# Patient Record
Sex: Female | Born: 1975 | Race: Black or African American | Hispanic: No | Marital: Single | Smoking: Never smoker
Health system: Southern US, Community
[De-identification: ages and names within clinical notes are randomized; demographics above are authoritative.]

## PROBLEM LIST (undated history)

## (undated) DIAGNOSIS — I2510 Atherosclerotic heart disease of native coronary artery without angina pectoris: Secondary | ICD-10-CM

## (undated) DIAGNOSIS — I5032 Chronic diastolic (congestive) heart failure: Secondary | ICD-10-CM

## (undated) DIAGNOSIS — I119 Hypertensive heart disease without heart failure: Secondary | ICD-10-CM

## (undated) HISTORY — DX: Hypertensive heart disease without heart failure: I11.9

## (undated) HISTORY — PX: OTHER SURGICAL HISTORY: SHX169

## (undated) HISTORY — DX: Chronic diastolic (congestive) heart failure: I50.32

## (undated) HISTORY — DX: Atherosclerotic heart disease of native coronary artery without angina pectoris: I25.10

---

## 2014-10-08 ENCOUNTER — Emergency Department (HOSPITAL_COMMUNITY): Payer: Self-pay

## 2014-10-08 ENCOUNTER — Encounter (HOSPITAL_COMMUNITY): Payer: Self-pay | Admitting: Emergency Medicine

## 2014-10-08 ENCOUNTER — Inpatient Hospital Stay (HOSPITAL_COMMUNITY)
Admission: EM | Admit: 2014-10-08 | Discharge: 2014-10-16 | DRG: 683 | Disposition: A | Discharge status: Home / Self Care | Payer: Self-pay | Attending: Internal Medicine | Admitting: Internal Medicine

## 2014-10-08 ENCOUNTER — Inpatient Hospital Stay (HOSPITAL_COMMUNITY): Payer: Self-pay

## 2014-10-08 DIAGNOSIS — R51 Headache: Secondary | ICD-10-CM | POA: Diagnosis present

## 2014-10-08 DIAGNOSIS — Z6841 Body Mass Index (BMI) 40.0 and over, adult: Secondary | ICD-10-CM

## 2014-10-08 DIAGNOSIS — I129 Hypertensive chronic kidney disease with stage 1 through stage 4 chronic kidney disease, or unspecified chronic kidney disease: Principal | ICD-10-CM | POA: Diagnosis present

## 2014-10-08 DIAGNOSIS — I10 Essential (primary) hypertension: Secondary | ICD-10-CM

## 2014-10-08 DIAGNOSIS — D649 Anemia, unspecified: Secondary | ICD-10-CM | POA: Diagnosis present

## 2014-10-08 DIAGNOSIS — Z7982 Long term (current) use of aspirin: Secondary | ICD-10-CM

## 2014-10-08 DIAGNOSIS — H538 Other visual disturbances: Secondary | ICD-10-CM | POA: Diagnosis present

## 2014-10-08 DIAGNOSIS — R519 Headache, unspecified: Secondary | ICD-10-CM

## 2014-10-08 DIAGNOSIS — N183 Chronic kidney disease, stage 3 (moderate): Secondary | ICD-10-CM | POA: Diagnosis present

## 2014-10-08 DIAGNOSIS — R778 Other specified abnormalities of plasma proteins: Secondary | ICD-10-CM | POA: Diagnosis present

## 2014-10-08 DIAGNOSIS — Z885 Allergy status to narcotic agent status: Secondary | ICD-10-CM

## 2014-10-08 DIAGNOSIS — I248 Other forms of acute ischemic heart disease: Secondary | ICD-10-CM | POA: Diagnosis present

## 2014-10-08 DIAGNOSIS — R7309 Other abnormal glucose: Secondary | ICD-10-CM | POA: Diagnosis present

## 2014-10-08 DIAGNOSIS — N179 Acute kidney failure, unspecified: Secondary | ICD-10-CM | POA: Diagnosis not present

## 2014-10-08 DIAGNOSIS — R7989 Other specified abnormal findings of blood chemistry: Secondary | ICD-10-CM

## 2014-10-08 DIAGNOSIS — I25119 Atherosclerotic heart disease of native coronary artery with unspecified angina pectoris: Secondary | ICD-10-CM | POA: Diagnosis present

## 2014-10-08 DIAGNOSIS — Z79899 Other long term (current) drug therapy: Secondary | ICD-10-CM

## 2014-10-08 DIAGNOSIS — I160 Hypertensive urgency: Secondary | ICD-10-CM | POA: Diagnosis present

## 2014-10-08 DIAGNOSIS — I422 Other hypertrophic cardiomyopathy: Secondary | ICD-10-CM | POA: Diagnosis present

## 2014-10-08 DIAGNOSIS — E669 Obesity, unspecified: Secondary | ICD-10-CM | POA: Diagnosis present

## 2014-10-08 DIAGNOSIS — R079 Chest pain, unspecified: Secondary | ICD-10-CM

## 2014-10-08 DIAGNOSIS — Z888 Allergy status to other drugs, medicaments and biological substances status: Secondary | ICD-10-CM

## 2014-10-08 DIAGNOSIS — E282 Polycystic ovarian syndrome: Secondary | ICD-10-CM | POA: Diagnosis present

## 2014-10-08 LAB — URINALYSIS, ROUTINE W REFLEX MICROSCOPIC
Bilirubin Urine: NEGATIVE
Glucose, UA: NEGATIVE mg/dL
Hgb urine dipstick: NEGATIVE
KETONES UR: NEGATIVE mg/dL
Leukocytes, UA: NEGATIVE
NITRITE: NEGATIVE
Protein, ur: NEGATIVE mg/dL
Specific Gravity, Urine: 1.02 (ref 1.005–1.030)
UROBILINOGEN UA: 0.2 mg/dL (ref 0.0–1.0)
pH: 6.5 (ref 5.0–8.0)

## 2014-10-08 LAB — BASIC METABOLIC PANEL WITH GFR
Anion gap: 9 (ref 5–15)
BUN: 16 mg/dL (ref 6–23)
CO2: 28 mmol/L (ref 19–32)
Calcium: 9.4 mg/dL (ref 8.4–10.5)
Chloride: 101 meq/L (ref 96–112)
Creatinine, Ser: 1.21 mg/dL — ABNORMAL HIGH (ref 0.50–1.10)
GFR calc Af Amer: 65 mL/min — ABNORMAL LOW (ref >90)
GFR calc non Af Amer: 56 mL/min — ABNORMAL LOW (ref >90)
Glucose, Bld: 130 mg/dL — ABNORMAL HIGH (ref 70–99)
Potassium: 3.2 mmol/L — ABNORMAL LOW (ref 3.5–5.1)
Sodium: 138 mmol/L (ref 135–145)

## 2014-10-08 LAB — BRAIN NATRIURETIC PEPTIDE: B Natriuretic Peptide: 22.1 pg/mL (ref 0.0–100.0)

## 2014-10-08 LAB — MRSA PCR SCREENING: MRSA by PCR: NEGATIVE

## 2014-10-08 LAB — CBC
HCT: 40.6 % (ref 36.0–46.0)
Hemoglobin: 13.3 g/dL (ref 12.0–15.0)
MCH: 27.1 pg (ref 26.0–34.0)
MCHC: 32.8 g/dL (ref 30.0–36.0)
MCV: 82.9 fL (ref 78.0–100.0)
PLATELETS: 317 10*3/uL (ref 150–400)
RBC: 4.9 MIL/uL (ref 3.87–5.11)
RDW: 14.4 % (ref 11.5–15.5)
WBC: 6.3 10*3/uL (ref 4.0–10.5)

## 2014-10-08 LAB — RAPID URINE DRUG SCREEN, HOSP PERFORMED
Amphetamines: NOT DETECTED
BARBITURATES: NOT DETECTED
BENZODIAZEPINES: NOT DETECTED
Cocaine: NOT DETECTED
Opiates: NOT DETECTED
TETRAHYDROCANNABINOL: NOT DETECTED

## 2014-10-08 LAB — I-STAT TROPONIN, ED: TROPONIN I, POC: 0 ng/mL (ref 0.00–0.08)

## 2014-10-08 LAB — PREGNANCY, URINE: Preg Test, Ur: NEGATIVE

## 2014-10-08 LAB — TROPONIN I

## 2014-10-08 MED ORDER — METOCLOPRAMIDE HCL 5 MG/ML IJ SOLN
10.0000 mg | Freq: Once | INTRAMUSCULAR | Status: AC
  Administered 2014-10-08: 10 mg via INTRAVENOUS
  Filled 2014-10-08: qty 2

## 2014-10-08 MED ORDER — ONDANSETRON HCL 4 MG/2ML IJ SOLN
4.0000 mg | Freq: Four times a day (QID) | INTRAMUSCULAR | Status: DC | PRN
  Administered 2014-10-08 – 2014-10-10 (×4): 4 mg via INTRAVENOUS
  Filled 2014-10-08 (×4): qty 2

## 2014-10-08 MED ORDER — ONDANSETRON HCL 4 MG PO TABS
4.0000 mg | ORAL_TABLET | Freq: Four times a day (QID) | ORAL | Status: DC | PRN
  Administered 2014-10-12: 4 mg via ORAL
  Filled 2014-10-08: qty 1

## 2014-10-08 MED ORDER — DIPHENHYDRAMINE HCL 50 MG/ML IJ SOLN
25.0000 mg | Freq: Once | INTRAMUSCULAR | Status: AC
  Administered 2014-10-08: 25 mg via INTRAVENOUS
  Filled 2014-10-08: qty 1

## 2014-10-08 MED ORDER — HYDROMORPHONE HCL 1 MG/ML IJ SOLN
0.5000 mg | Freq: Once | INTRAMUSCULAR | Status: AC
  Administered 2014-10-08: 0.5 mg via INTRAVENOUS
  Filled 2014-10-08: qty 1

## 2014-10-08 MED ORDER — SODIUM CHLORIDE 0.9 % IJ SOLN
3.0000 mL | Freq: Two times a day (BID) | INTRAMUSCULAR | Status: DC
  Administered 2014-10-08 – 2014-10-15 (×7): 3 mL via INTRAVENOUS

## 2014-10-08 MED ORDER — HYDRALAZINE HCL 20 MG/ML IJ SOLN
10.0000 mg | Freq: Four times a day (QID) | INTRAMUSCULAR | Status: DC | PRN
  Administered 2014-10-08 – 2014-10-10 (×3): 10 mg via INTRAVENOUS
  Filled 2014-10-08 (×3): qty 1

## 2014-10-08 MED ORDER — LABETALOL HCL 5 MG/ML IV SOLN
20.0000 mg | Freq: Once | INTRAVENOUS | Status: AC
  Administered 2014-10-08: 20 mg via INTRAVENOUS
  Filled 2014-10-08: qty 4

## 2014-10-08 MED ORDER — ACETAMINOPHEN 325 MG PO TABS
650.0000 mg | ORAL_TABLET | Freq: Four times a day (QID) | ORAL | Status: DC | PRN
Start: 2014-10-08 — End: 2014-10-16
  Administered 2014-10-11: 650 mg via ORAL
  Filled 2014-10-08 (×2): qty 2

## 2014-10-08 MED ORDER — MORPHINE SULFATE 2 MG/ML IJ SOLN
1.0000 mg | INTRAMUSCULAR | Status: DC | PRN
  Administered 2014-10-08 – 2014-10-11 (×6): 1 mg via INTRAVENOUS
  Filled 2014-10-08 (×6): qty 1

## 2014-10-08 MED ORDER — HYDRALAZINE HCL 20 MG/ML IJ SOLN
5.0000 mg | Freq: Once | INTRAMUSCULAR | Status: AC
  Administered 2014-10-08: 5 mg via INTRAVENOUS
  Filled 2014-10-08: qty 1

## 2014-10-08 MED ORDER — ENOXAPARIN SODIUM 60 MG/0.6ML ~~LOC~~ SOLN
60.0000 mg | SUBCUTANEOUS | Status: DC
  Administered 2014-10-09: 60 mg via SUBCUTANEOUS
  Filled 2014-10-08 (×3): qty 0.6

## 2014-10-08 MED ORDER — ACETAMINOPHEN 650 MG RE SUPP: 650.0000 mg | Freq: Four times a day (QID) | RECTAL | Status: DC | PRN

## 2014-10-08 MED ORDER — AMLODIPINE BESYLATE 5 MG PO TABS
5.0000 mg | ORAL_TABLET | Freq: Every day | ORAL | Status: DC
  Administered 2014-10-08 – 2014-10-10 (×3): 5 mg via ORAL
  Filled 2014-10-08 (×3): qty 1

## 2014-10-08 MED ORDER — LABETALOL HCL 5 MG/ML IV SOLN
10.0000 mg | Freq: Once | INTRAVENOUS | Status: AC
  Administered 2014-10-08: 10 mg via INTRAVENOUS
  Filled 2014-10-08: qty 4

## 2014-10-08 MED ORDER — SODIUM CHLORIDE 0.9 % IV SOLN: 250.0000 mL | INTRAVENOUS | Status: DC | PRN

## 2014-10-08 MED ORDER — SODIUM CHLORIDE 0.9 % IV BOLUS (SEPSIS)
500.0000 mL | Freq: Once | INTRAVENOUS | Status: AC
  Administered 2014-10-08: 500 mL via INTRAVENOUS

## 2014-10-08 MED ORDER — SODIUM CHLORIDE 0.9 % IJ SOLN: 3.0000 mL | INTRAMUSCULAR | Status: DC | PRN

## 2014-10-08 MED ORDER — HYDRALAZINE HCL 20 MG/ML IJ SOLN
10.0000 mg | Freq: Once | INTRAMUSCULAR | Status: AC
  Administered 2014-10-08: 10 mg via INTRAVENOUS
  Filled 2014-10-08: qty 1

## 2014-10-08 MED ORDER — ASPIRIN EC 325 MG PO TBEC
325.0000 mg | DELAYED_RELEASE_TABLET | Freq: Every day | ORAL | Status: DC
  Administered 2014-10-08 – 2014-10-09 (×2): 325 mg via ORAL
  Filled 2014-10-08 (×3): qty 1

## 2014-10-08 MED ORDER — CLONIDINE HCL 0.1 MG PO TABS
0.1000 mg | ORAL_TABLET | Freq: Once | ORAL | Status: AC
  Administered 2014-10-08: 0.1 mg via ORAL
  Filled 2014-10-08: qty 1

## 2014-10-08 MED ORDER — POTASSIUM CHLORIDE CRYS ER 20 MEQ PO TBCR
40.0000 meq | EXTENDED_RELEASE_TABLET | Freq: Once | ORAL | Status: AC
  Administered 2014-10-08: 40 meq via ORAL
  Filled 2014-10-08: qty 2

## 2014-10-08 MED ORDER — HYDROCODONE-ACETAMINOPHEN 5-325 MG PO TABS
1.0000 | ORAL_TABLET | ORAL | Status: DC | PRN
  Administered 2014-10-09 (×5): 1 via ORAL
  Administered 2014-10-10 (×3): 2 via ORAL
  Administered 2014-10-11: 1 via ORAL
  Administered 2014-10-11 – 2014-10-15 (×14): 2 via ORAL
  Filled 2014-10-08 (×2): qty 2
  Filled 2014-10-08 (×2): qty 1
  Filled 2014-10-08 (×6): qty 2
  Filled 2014-10-08: qty 1
  Filled 2014-10-08 (×5): qty 2
  Filled 2014-10-08: qty 1
  Filled 2014-10-08 (×4): qty 2
  Filled 2014-10-08: qty 1
  Filled 2014-10-08: qty 2

## 2014-10-08 NOTE — ED Notes (Signed)
Pt ambulated to restroom with steady gait.

## 2014-10-08 NOTE — Progress Notes (Signed)
Pt still has b/p of 201/110 after given hydralazine. Called midlevel awaitng call back.

## 2014-10-08 NOTE — ED Notes (Signed)
MD Reese at bedside 

## 2014-10-08 NOTE — Progress Notes (Signed)
Pt has b/p of 189/91 midlevel called awaiting call back.

## 2014-10-08 NOTE — H&P (Signed)
[facility] History and Physical  Theresa Soto ZOX:[id number] DOB: September 11, 1976 DOA: 10/08/2014  Referring physician: Dr Pecola Leisure PCP: No PCP Per Patient   Chief Complaint: headaches.   HPI: Theresa Soto is a 39 y.o. female with no significant PMH, who presents to ED complainingof headaches. She report history of headaches on and off. Headache got worse 2 days prior to admission. She was not able to tolerates headaches today. Headaches is better since her blood pressure is lower. She denies any prior history of HTN. She denies focal weakness, she does report mild blurry vision for last 2 days that is better now. No dysarthria. She also presents with chest pain, pressure like,sharp. Chest pain has resolved since her BP has decreased.   Evaluation in the ED; She received IV hydralazine and labetalol. Her BP initially was 234/142. CT head : Chronic atrophic and ischemic changes without acute abnormality. Chest x ray negative for cardiopulmonary diseases.    Review of Systems:  Negative except asper HPI.    History reviewed. No  past medical history. History reviewed. No pertinent past surgical history. Social History:  reports that she has never smoked. She does not have any smokeless tobacco history on file. She reports that she does not drink alcohol or use illicit drugs.  Allergies  Allergen Reactions  . Codeine     Hives  . Flexeril [Cyclobenzaprine] Hives    Family History; father renal failure on dialysis. CHF. Mother Unknown medical problems.   Prior to Admission medications   Medication Sig Start Date End Date Taking? Authorizing Provider  aspirin-acetaminophen-caffeine (EXCEDRIN MIGRAINE) [phone] MG per tablet Take by mouth every 6 (six) hours as needed for headache (and pain.).   Yes Historical Provider, MD   Physical Exam: Filed Vitals:   10/08/14 1424 10/08/14 1504 10/08/14 1608 10/08/14 1630  BP: 234/142 190/118 200/109 184/113  Pulse: 87 93 103 88  Temp:       TempSrc:      Resp: SpO2: 99%  98% 100%    Wt Readings from Last 3 Encounters:  No data found for Wt    General:  Appears calm and comfortable Eyes: PERRL, normal lids, irises & conjunctiva ENT: grossly normal hearing, lips & tongue Neck: no LAD, masses or thyromegaly Cardiovascular: RRR, no m/r/g. No LE edema. Telemetry: SR, no arrhythmias  Respiratory: CTA bilaterally, no w/r/r. Normal respiratory effort. Abdomen: soft, ntnd Skin: no rash or induration seen on limited exam Musculoskeletal: grossly normal tone BUE/BLE Psychiatric: grossly normal mood and affect, speech fluent and appropriate Neurologic: grossly non-focal. Alert answering question.s          Labs on Admission:  Basic Metabolic Panel:  Recent Labs Lab 10/08/14 1343  NA 138  K 3.2*  CL 101  CO2 28  GLUCOSE 130*  BUN 16  CREATININE 1.21*  CALCIUM 9.4   Liver Function Tests: No results for input(s): AST, ALT, ALKPHOS, BILITOT, PROT, ALBUMIN in the last 168 hours. No results for input(s): LIPASE, AMYLASE in the last 168 hours. No results for input(s): AMMONIA in the last 168 hours. CBC:  Recent Labs Lab 10/08/14 1343  WBC 6.3  HGB 13.3  HCT 40.6  MCV 82.9  PLT 317   Cardiac Enzymes: No results for input(s): CKTOTAL, CKMB, CKMBINDEX, TROPONINI in the last 168 hours.  BNP (last 3 results) No results for input(s): PROBNP in the last 8760 hours. CBG: No results for input(s): GLUCAP in the last 168 hours.  Radiological  Exams on Admission: Dg Chest 2 View  10/08/2014   CLINICAL DATA:  Complains of headache, nausea and chest pressure.  EXAM: CHEST  2 VIEW  COMPARISON:  None.  FINDINGS: Lungs are clear. Heart and mediastinum are within normal limits. The trachea is midline. Negative for pleural effusions. Bone structures are unremarkable.  IMPRESSION: No active cardiopulmonary disease.   Electronically Signed   By: Richarda OverlieAdam  Henn M.D.   On: 10/08/2014 14:58   Ct Head Wo  Contrast  10/08/2014   CLINICAL DATA:  Left-sided temporal headache for 2 days, initial encounter  EXAM: CT HEAD WITHOUT CONTRAST  TECHNIQUE: Contiguous axial images were obtained from the base of the skull through the vertex without intravenous contrast.  COMPARISON:  08/10/2013  FINDINGS: Mild atrophic changes and chronic white matter ischemic change are again noted stable from the prior exam. No findings to suggest acute hemorrhage, acute infarction or space-occupying mass lesion are noted.  IMPRESSION: Chronic atrophic and ischemic changes without acute abnormality.   Electronically Signed   By: Alcide CleverMark  Lukens M.D.   On: 10/08/2014 15:31    EKG: Independently reviewed. Sinus rhythm.   Assessment/Plan Active Problems:   Hypertensive urgency   1-Hypertensive Urgency: She received IV hydralazine and Labetalol in the ED.  I will Start low dose norvasc. Will continue with PRN Hydralazine for SBP more than 200.  Due to headaches, dizziness, blurry vision will order MRI. Frequent neuro exam. Daily aspirin.  Check UDS.   2-Chest pain; in setting of severe HTN. Chest pain free. Will cycle cardiac enzymes. Will order ECHO.   3-Renal insufficiency; repeat renal function in am/   4-Screening for HIV ordered.   Code Status: full code.  DVT Prophylaxis:lovenox.  Family Communication: Care discussed with patient.  Disposition Plan: expect 2 to 3 days inpatient.   Time spent: 75 minutes.   Theresa Soto, Theresa Soto A [facility] Pager [phone]

## 2014-10-08 NOTE — ED Provider Notes (Signed)
CSN: [id number]     Arrival date & time 10/08/14  1327 History   First MD Initiated Contact with Patient 10/08/14 1412     Chief Complaint  Patient presents with  . Chest Pain  . Headache  . Shortness of Breath     Patient is a 39 y.o. female presenting with chest pain, headaches, and shortness of breath. The history is provided by the patient. No language interpreter was used.  Chest Pain Associated symptoms: headache and shortness of breath   Headache Shortness of Breath Associated symptoms: chest pain and headaches    Theresa Soto is an evaluation of headache and chest pain. She reports 2 days of headache. Started gradual in onset on the right side and then became an occipital headache starting yesterday. It is described as throbbing in nature. She reports mild blurred vision. There is no alleviating or worsening symptoms. Yesterday she developed intermittent chest pain described as a sharp right-sided pain associated with shortness of breath. It is nonradiating and there is no alleviating or worsening factors. She denies any fevers, cough, leg swelling or pain, numbness or weakness, change in urination. She has a family history of hypertension and kidney disease. She denies any history of high blood pressure or medical problems. Symptoms are moderate, constant, worsening.  History reviewed. No pertinent past medical history. History reviewed. No pertinent past surgical history. History reviewed. No pertinent family history. History  Substance Use Topics  . Smoking status: Never Smoker   . Smokeless tobacco: Not on file  . Alcohol Use: No   OB History    No data available     Review of Systems  Respiratory: Positive for shortness of breath.   Cardiovascular: Positive for chest pain.  Neurological: Positive for headaches.  All other systems reviewed and are negative.     Allergies  Codeine and Flexeril  Home Medications   Prior to Admission medications   Medication Sig  Start Date End Date Taking? Authorizing Provider  aspirin-acetaminophen-caffeine (EXCEDRIN MIGRAINE) [phone] MG per tablet Take by mouth every 6 (six) hours as needed for headache (and pain.).   Yes Historical Provider, MD   BP 234/142 mmHg  Pulse 87  Temp(Src) 98.1 F (36.7 C) (Oral)  Resp 16  SpO2 99%  LMP 09/09/2014 Physical Exam  Constitutional: She is oriented to person, place, and time. She appears well-developed and well-nourished.  HENT:  Head: Normocephalic and atraumatic.  Eyes: EOM are normal. Pupils are equal, round, and reactive to light.  Cardiovascular: Normal rate and regular rhythm.   No murmur heard. Pulmonary/Chest: Effort normal and breath sounds normal. No respiratory distress.  Abdominal: Soft. There is no tenderness. There is no rebound and no guarding.  Musculoskeletal: She exhibits no edema or tenderness.  Neurological: She is alert and oriented to person, place, and time. No cranial nerve deficit.  Skin: Skin is warm and dry.  Psychiatric: She has a normal mood and affect. Her behavior is normal.  Nursing note and vitals reviewed.   ED Course  Procedures (including critical care time) Labs Review Labs Reviewed  BASIC METABOLIC PANEL - Abnormal; Notable for the following:    Potassium 3.2 (*)    Glucose, Bld 130 (*)    Creatinine, Ser 1.21 (*)    GFR calc non Af Amer 56 (*)    GFR calc Af Amer 65 (*)    All other components within normal limits  URINALYSIS, ROUTINE W REFLEX MICROSCOPIC - Abnormal; Notable for the following:  APPearance CLOUDY (*)    All other components within normal limits  CBC  BRAIN NATRIURETIC PEPTIDE  PREGNANCY, URINE  I-STAT TROPOININ, ED    Imaging Review Dg Chest 2 View  10/08/2014   CLINICAL DATA:  Complains of headache, nausea and chest pressure.  EXAM: CHEST  2 VIEW  COMPARISON:  None.  FINDINGS: Lungs are clear. Heart and mediastinum are within normal limits. The trachea is midline. Negative for pleural  effusions. Bone structures are unremarkable.  IMPRESSION: No active cardiopulmonary disease.   Electronically Signed   By: Richarda OverlieAdam  Henn M.D.   On: 10/08/2014 14:58   Ct Head Wo Contrast  10/08/2014   CLINICAL DATA:  Left-sided temporal headache for 2 days, initial encounter  EXAM: CT HEAD WITHOUT CONTRAST  TECHNIQUE: Contiguous axial images were obtained from the base of the skull through the vertex without intravenous contrast.  COMPARISON:  08/10/2013  FINDINGS: Mild atrophic changes and chronic white matter ischemic change are again noted stable from the prior exam. No findings to suggest acute hemorrhage, acute infarction or space-occupying mass lesion are noted.  IMPRESSION: Chronic atrophic and ischemic changes without acute abnormality.   Electronically Signed   By: Alcide CleverMark  Lukens M.D.   On: 10/08/2014 15:31     EKG Interpretation   Date/Time:  Wednesday October 08 2014 13:40:30 EST Ventricular Rate:  89 PR Interval:  222 QRS Duration: 98 QT Interval:  383 QTC Calculation: 466 R Axis:   5 Text Interpretation:  Sinus rhythm Prolonged PR interval Abnormal R-wave  progression, late transition Left ventricular hypertrophy Borderline T  abnormalities, lateral leads Baseline wander in lead(s) II III aVF  Confirmed by Theresa Soto, Theresa Soto      MDM   Final diagnoses:  Hypertensive urgency  Nonintractable headache, unspecified chronicity pattern, unspecified headache type  Chest pain, unspecified chest pain type    Patient with no past medical history here for evaluation of progressive headache, chest pain, shortness of breath. Patient noted to be markedly hypertensive in the emergency department. Clinical picture is not consistent with subarachnoid hemorrhage, meningitis, dissection, ACS. Treating headache and hypertension plan to admit for hypertensive urgency. Medicine consultation for admission.    Tilden FossaElizabeth Kaliel Bolds, MD 10/08/14 [phone]

## 2014-10-08 NOTE — ED Notes (Signed)
Pt to have MRI of Brain first before transfer to Floor Unit.

## 2014-10-08 NOTE — ED Notes (Addendum)
Pt c/o headache, nausea, pressure chest pain, pressure moving across her left arm and neck, and SOB x 2 days. PERRLA, neurologically intact.

## 2014-10-09 ENCOUNTER — Encounter (HOSPITAL_COMMUNITY): Payer: Self-pay | Admitting: Cardiovascular Disease

## 2014-10-09 DIAGNOSIS — R079 Chest pain, unspecified: Secondary | ICD-10-CM

## 2014-10-09 LAB — TROPONIN I
TROPONIN I: 0.15 ng/mL — AB (ref <0.031)
TROPONIN I: 0.24 ng/mL — AB (ref <0.031)
TROPONIN I: 0.28 ng/mL — AB (ref <0.031)
Troponin I: <0.03 ng/mL (ref <0.031)
Troponin I: 0.24 ng/mL — ABNORMAL HIGH (ref <0.031)

## 2014-10-09 LAB — HIV ANTIBODY (ROUTINE TESTING W REFLEX)
HIV 1/HIV 2 AB: NONREACTIVE
HIV 1/O/2 Abs-Index Value: <1 (ref <1.00)

## 2014-10-09 LAB — PROTIME-INR
INR: 1.13 (ref 0.00–1.49)
PROTHROMBIN TIME: 14.6 s (ref 11.6–15.2)

## 2014-10-09 LAB — CBC
HCT: 39 % (ref 36.0–46.0)
Hemoglobin: 12.7 g/dL (ref 12.0–15.0)
MCH: 27 pg (ref 26.0–34.0)
MCHC: 32.6 g/dL (ref 30.0–36.0)
MCV: 82.8 fL (ref 78.0–100.0)
PLATELETS: 308 10*3/uL (ref 150–400)
RBC: 4.71 MIL/uL (ref 3.87–5.11)
RDW: 14.6 % (ref 11.5–15.5)
WBC: 9.6 10*3/uL (ref 4.0–10.5)

## 2014-10-09 LAB — COMPREHENSIVE METABOLIC PANEL
ALT: 17 U/L (ref 0–35)
ANION GAP: 8 (ref 5–15)
AST: 23 U/L (ref 0–37)
Albumin: 3.7 g/dL (ref 3.5–5.2)
Alkaline Phosphatase: 45 U/L (ref 39–117)
BUN: 15 mg/dL (ref 6–23)
CALCIUM: 8.8 mg/dL (ref 8.4–10.5)
CO2: 25 mmol/L (ref 19–32)
CREATININE: 1.13 mg/dL — AB (ref 0.50–1.10)
Chloride: 101 mEq/L (ref 96–112)
GFR calc Af Amer: 71 mL/min — ABNORMAL LOW (ref >90)
GFR calc non Af Amer: 61 mL/min — ABNORMAL LOW (ref >90)
Glucose, Bld: 104 mg/dL — ABNORMAL HIGH (ref 70–99)
Potassium: 3.5 mmol/L (ref 3.5–5.1)
SODIUM: 134 mmol/L — AB (ref 135–145)
TOTAL PROTEIN: 7.3 g/dL (ref 6.0–8.3)
Total Bilirubin: 0.6 mg/dL (ref 0.3–1.2)

## 2014-10-09 MED ORDER — NITROGLYCERIN IN D5W 200-5 MCG/ML-% IV SOLN
0.0000 ug/min | INTRAVENOUS | Status: DC
  Administered 2014-10-09: 10 ug/min via INTRAVENOUS
  Filled 2014-10-09: qty 250

## 2014-10-09 MED ORDER — NITROGLYCERIN 0.4 MG SL SUBL
0.4000 mg | SUBLINGUAL_TABLET | SUBLINGUAL | Status: DC | PRN
  Administered 2014-10-09 (×2): 0.4 mg via SUBLINGUAL
  Filled 2014-10-09 (×2): qty 1

## 2014-10-09 MED ORDER — CLONIDINE HCL 0.1 MG PO TABS
0.1000 mg | ORAL_TABLET | Freq: Once | ORAL | Status: AC
  Administered 2014-10-09: 0.1 mg via ORAL
  Filled 2014-10-09: qty 1

## 2014-10-09 MED ORDER — SODIUM CHLORIDE 0.9 % IV SOLN
250.0000 mL | INTRAVENOUS | Status: DC | PRN
Start: 2014-10-09 — End: 2014-10-10

## 2014-10-09 MED ORDER — ASPIRIN 81 MG PO CHEW
81.0000 mg | CHEWABLE_TABLET | ORAL | Status: AC
  Administered 2014-10-10: 81 mg via ORAL
  Filled 2014-10-09: qty 1

## 2014-10-09 MED ORDER — ANGIOPLASTY BOOK
Freq: Once | Status: AC
  Administered 2014-10-09: 20:00:00
  Filled 2014-10-09: qty 1

## 2014-10-09 MED ORDER — SODIUM CHLORIDE 0.9 % IJ SOLN
3.0000 mL | Freq: Two times a day (BID) | INTRAMUSCULAR | Status: DC
  Administered 2014-10-09: 3 mL via INTRAVENOUS

## 2014-10-09 MED ORDER — SODIUM CHLORIDE 0.9 % IJ SOLN: 3.0000 mL | INTRAMUSCULAR | Status: DC | PRN

## 2014-10-09 MED ORDER — METOPROLOL TARTRATE 12.5 MG HALF TABLET
12.5000 mg | ORAL_TABLET | Freq: Two times a day (BID) | ORAL | Status: DC
  Administered 2014-10-09 – 2014-10-10 (×3): 12.5 mg via ORAL
  Filled 2014-10-09 (×3): qty 1

## 2014-10-09 MED ORDER — SODIUM CHLORIDE 0.9 % IV SOLN: INTRAVENOUS | Status: DC

## 2014-10-09 MED ORDER — CIPROFLOXACIN-DEXAMETHASONE 0.3-0.1 % OT SUSP
4.0000 [drp] | Freq: Two times a day (BID) | OTIC | Status: DC
  Administered 2014-10-09 – 2014-10-16 (×15): 4 [drp] via OTIC
  Filled 2014-10-09 (×2): qty 7.5

## 2014-10-09 MED ORDER — POLYETHYLENE GLYCOL 3350 17 G PO PACK
17.0000 g | PACK | Freq: Every day | ORAL | Status: DC
  Administered 2014-10-09 – 2014-10-14 (×5): 17 g via ORAL
  Filled 2014-10-09 (×6): qty 1

## 2014-10-09 MED ORDER — DOCUSATE SODIUM 50 MG PO CAPS
50.0000 mg | ORAL_CAPSULE | Freq: Every day | ORAL | Status: DC
  Administered 2014-10-09 – 2014-10-16 (×5): 50 mg via ORAL
  Filled 2014-10-09 (×8): qty 1

## 2014-10-09 NOTE — Progress Notes (Signed)
Pt with a troponin of 0.15 md called awaiting call back.

## 2014-10-09 NOTE — Progress Notes (Signed)
Pt has b/p of 119/118. Midlevel paged awaiting call back.

## 2014-10-09 NOTE — Consult Note (Signed)
Patient ID: Theresa LevanMelisa Soto MRN: [id number] DOB/AGE: [birth year] 39 y.o.  Admit date: 10/08/2014 Referring Physician: Sunnie Nielsenegalado Primary Cardiologist: New Reason for Consultation: Elevated troponin  HPI: 39 yo female with history of HTN admitted with hypertensive urgency, headache, chest pain. Initial BP 240/140. Headache has improved with better control of BP. Also c/o sharp chest pain on admission with slight pressure across chest and dyspnea. This has mostly resolved overnight. EKG with sinus rhythm, lateral T wave flattening, LVH. Troponin negative x 2 but last troponin this am is 0.15. Echo has been ordered for today. She denies any prior cardiac issues. She has not seen a physician in many years and has no documentation of her BP over last few years. Her father had HTN leading to ESRD. Her father also has has two previous MIs in his mid 6850s. She has had no exertional angina or dyspnea at home. She has never been a smoker.    Past Medical History  Diagnosis Date  . HTN (hypertension)     Family History  Problem Relation Age of Onset  . Hypertension Father   . Kidney disease Father   . Heart attack Father     History   Social History  . Marital Status: Single    Spouse Name: N/A    Number of Children: 0  . Years of Education: N/A   Occupational History  . Not on file.   Social History Main Topics  . Smoking status: Never Smoker   . Smokeless tobacco: Not on file  . Alcohol Use: 0.0 oz/week    0 Not specified per week     Comment: Occasional glass of wine  . Drug Use: No  . Sexual Activity: Not on file   Other Topics Concern  . Not on file   Social History Narrative    Past Surgical History  Procedure Laterality Date  . None      Allergies  Allergen Reactions  . Codeine     Hives  . Flexeril [Cyclobenzaprine] Hives   Hospital Medications:  . amLODipine  5 mg Oral Daily  . aspirin EC  325 mg Oral Daily  . ciprofloxacin-dexamethasone  4 drop Right Ear  BID  . enoxaparin (LOVENOX) injection  60 mg Subcutaneous Q24H  . metoprolol tartrate  12.5 mg Oral BID  . sodium chloride  3 mL Intravenous Q12H    Prior to Admission medications   Medication Sig Start Date End Date Taking? Authorizing Provider  aspirin-acetaminophen-caffeine (EXCEDRIN MIGRAINE) [phone] MG per tablet Take by mouth every 6 (six) hours as needed for headache (and pain.).   Yes Historical Provider, MD    Review of systems complete and found to be negative unless listed above    Physical Exam: Blood pressure 175/111, pulse 90, temperature 98.1 F (36.7 C), temperature source Oral, resp. rate 19, height 5\' 6"  (1.676 m), weight 274 lb 4 oz (124.4 kg), last menstrual period 09/09/2014, SpO2 100 %.    General: Well developed, well nourished, NAD  HEENT: OP clear, mucus membranes moist  SKIN: warm, dry. No rashes.  Neuro: No focal deficits  Musculoskeletal: Muscle strength 5/5 all ext  Psychiatric: Mood and affect normal  Neck: No JVD, no carotid bruits, no thyromegaly, no lymphadenopathy.  Lungs:Clear bilaterally, no wheezes, rhonci, crackles  Cardiovascular: Regular rate and rhythm. No murmurs, gallops or rubs.  Abdomen:Soft. Bowel sounds present. Non-tender.  Extremities: No lower extremity edema. Pulses are 2 + in the bilateral DP/PT.  Labs:  Lab Results  Component Value Date   WBC 9.6 10/09/2014   HGB 12.7 10/09/2014   HCT 39.0 10/09/2014   MCV 82.8 10/09/2014   PLT 308 10/09/2014     Recent Labs Lab 10/09/14 0550  NA 134*  K 3.5  CL 101  CO2 25  BUN 15  CREATININE 1.13*  CALCIUM 8.8  PROT 7.3  BILITOT 0.6  ALKPHOS 45  ALT 17  AST 23  GLUCOSE 104*   Lab Results  Component Value Date   TROPONINI 0.15* 10/09/2014      Chest x-ray: Lungs are clear. Heart and mediastinum are within normal limits. The trachea is midline. Negative for pleural effusions. Bone structures are unremarkable. IMPRESSION: No active cardiopulmonary disease.    EKG: sinus, LVH, flat T waves laterally  ASSESSMENT AND PLAN:   1. Elevated troponin: Likely due to demand ischemia in setting of hypertensive urgency. Based on her EKG, I would expect her to have some degree of LVH on her echo. Her echo images are pending. Troponin mildly elevated so would continue to follow the trend. Will make NPO at midnight and arrange Lexiscan stress myoview for tomorrow am. Low suspicion for acute coronary syndrome  2. Hypertensive urgency: BP medications being titrated by the primary team.    Signed: Verne Carrow, MD 10/09/2014, 9:34 AM

## 2014-10-09 NOTE — Progress Notes (Signed)
UR completed 

## 2014-10-09 NOTE — Progress Notes (Signed)
Called by nursing this afternoon. Pt with onset of chest pressure. Mostly relieved with 2 SL NTG. NTG drip has been started. She has continued mild chest discomfort at this time. Echo with severe LVH c/w with Hypertensive heart disease. Given her rising troponin and chest pain despite better control of BP, will cancel stress test and plan cardiac cath with possible PCI.  I have personally reviewed the procedure with the patient including risks and benefits and she agrees to proceed. Cath around 10 am at [facility] with Dr. Smith. Transport in am via [facility].   Cath orders placed. NPO at midnight.   MCALHANY,CHRISTOPHER 3:40 PM 10/09/2014 

## 2014-10-09 NOTE — Progress Notes (Signed)
[facility] PROGRESS NOTE  Theresa Soto UJW:[id number] DOB: March 26, 1976 DOA: 10/08/2014 PCP: No PCP Per Patient  Assessment/Plan: 1-HTN urgency:  -MRI negative for stroke. MRI showed chronic ischemic changes vs demyelinating diseases. Needs follow up with neurology.  -continue with Norvasc. I will add metoprolol.  -PRN hydralazine.  -UDS negative.   2-Mild elevated troponin:  -She was admitted with chest pain. She had another episode overnight.  -I suspect chest pain and elevation of troponin are related to elevated BP, But will consult cardiology for further evaluation./ -ECHO ordered.  -continue to cycle enzymes.  -will start metoprolol, continue with aspirin.   3-Mastoid effusion;  Will start with cipro, otic.  Needs follow up with ENT.  This might also explain headaches.   4-Headache; might be related to elevated BP, vs mastoid effusion.  MRI negative for stroke.  Vicodin PRN>   5-Renal insufficiency:  In setting HTN. Cr trending down.   6-Screening for HIV test results pending.   Code Status: Full Code.  Family Communication: Care discussed with patient.  Disposition Plan: transfer to telemetry.    Consultants:  Cardiology  Procedures:  ECHO.   Antibiotics:  cipro otic.   HPI/Subjective: Feeling better than yesterday, still with headaches.   Objective: Filed Vitals:   10/09/14 0801  BP: 175/111  Pulse:   Temp:   Resp:     Intake/Output Summary (Last 24 hours) at 10/09/14 0807 Last data filed at 10/09/14 0600  Gross per 24 hour  Intake    980 ml  Output    800 ml  Net    180 ml   Filed Weights   10/08/14 1957  Weight: 124.4 kg (274 lb 4 oz)    Exam:   General:  Alert in no distress.   Cardiovascular: S 1, S 2 RRR  Respiratory: CTA  Abdomen: Bs present, soft, NT  Musculoskeletal: no edema.    Data Reviewed: Basic Metabolic Panel:  Recent Labs Lab 10/08/14 1343 10/09/14 0550  NA 138 134*  K 3.2* 3.5  CL 101 101   CO2 28 25  GLUCOSE 130* 104*  BUN 16 15  CREATININE 1.21* 1.13*  CALCIUM 9.4 8.8   Liver Function Tests:  Recent Labs Lab 10/09/14 0550  AST 23  ALT 17  ALKPHOS 45  BILITOT 0.6  PROT 7.3  ALBUMIN 3.7   No results for input(s): LIPASE, AMYLASE in the last 168 hours. No results for input(s): AMMONIA in the last 168 hours. CBC:  Recent Labs Lab 10/08/14 1343 10/09/14 0550  WBC 6.3 9.6  HGB 13.3 12.7  HCT 40.6 39.0  MCV 82.9 82.8  PLT 317 308   Cardiac Enzymes:  Recent Labs Lab 10/08/14 1703 10/08/14 2319 10/09/14 0550  TROPONINI <0.03 <0.03 0.15*   BNP (last 3 results) No results for input(s): PROBNP in the last 8760 hours. CBG: No results for input(s): GLUCAP in the last 168 hours.  Recent Results (from the past 240 hour(s))  MRSA PCR Screening     Status: None   Collection Time: 10/08/14  7:55 PM  Result Value Ref Range Status   MRSA by PCR NEGATIVE NEGATIVE Final    Comment:        The GeneXpert MRSA Assay (FDA approved for NASAL specimens only), is one component of a comprehensive MRSA colonization surveillance program. It is not intended to diagnose MRSA infection nor to guide or monitor treatment for MRSA infections.      Studies: Dg Chest 2 View  10/08/2014  CLINICAL DATA:  Complains of headache, nausea and chest pressure.  EXAM: CHEST  2 VIEW  COMPARISON:  None.  FINDINGS: Lungs are clear. Heart and mediastinum are within normal limits. The trachea is midline. Negative for pleural effusions. Bone structures are unremarkable.  IMPRESSION: No active cardiopulmonary disease.   Electronically Signed   By: Richarda OverlieAdam  Henn M.D.   On: 10/08/2014 14:58   Ct Head Wo Contrast  10/08/2014   CLINICAL DATA:  Left-sided temporal headache for 2 days, initial encounter  EXAM: CT HEAD WITHOUT CONTRAST  TECHNIQUE: Contiguous axial images were obtained from the base of the skull through the vertex without intravenous contrast.  COMPARISON:  08/10/2013  FINDINGS:  Mild atrophic changes and chronic white matter ischemic change are again noted stable from the prior exam. No findings to suggest acute hemorrhage, acute infarction or space-occupying mass lesion are noted.  IMPRESSION: Chronic atrophic and ischemic changes without acute abnormality.   Electronically Signed   By: Alcide CleverMark  Lukens M.D.   On: 10/08/2014 15:31   Mr Brain Wo Contrast  10/08/2014   CLINICAL DATA:  Progressive headaches with shortness of breath.  EXAM: MRI HEAD WITHOUT CONTRAST  TECHNIQUE: Multiplanar, multiecho pulse sequences of the brain and surrounding structures were obtained without intravenous contrast.  COMPARISON:  CT head without contrast 10/08/2014  FINDINGS: Moderate generalized atrophy is present. Confluent periventricular shin white matter changes are present bilaterally with other scattered subcortical T2 changes is well. The corpus callosum is intact. No acute infarct, hemorrhage, or mass lesion is present. The ventricles are proportionate to the degree of atrophy. No significant extra-axial fluid collections are present. Mild prominence of the extra-axial CSF is due to atrophy.  Flow is present in the major intracranial arteries. The globes and orbits are intact. The paranasal sinuses are clear. A right mastoid effusion is evident. No obstructing nasopharyngeal lesion is present.  IMPRESSION: 1. Moderate generalized atrophy. This may be related to chronic microvascular ischemic changes. 2. Confluent periventricular and subcortical white matter changes bilaterally. The differential diagnosis include severe microvascular changes, markedly premature for age, versus a demyelinating process. 3. Prominent right mastoid effusion. No obstructing nasopharyngeal lesion is evident.   Electronically Signed   By: Gennette Pachris  Mattern M.D.   On: 10/08/2014 19:32    Scheduled Meds: . amLODipine  5 mg Oral Daily  . aspirin EC  325 mg Oral Daily  . enoxaparin (LOVENOX) injection  60 mg Subcutaneous Q24H   . sodium chloride  3 mL Intravenous Q12H   Continuous Infusions:   Active Problems:   Hypertensive urgency    Time spent: 35 minutes.     Hartley Barefootegalado, Antron Seth A  [facility] Pager [phone]. If 7PM-7AM, please contact night-coverage at [email or web address], password [email or web address] 10/09/2014, 8:07 AM  LOS: 1 day

## 2014-10-09 NOTE — Progress Notes (Signed)
  Echocardiogram 2D Echocardiogram has been performed.  Janalyn HarderWest, Rinnah Peppel R 10/09/2014, 9:04 AM

## 2014-10-10 ENCOUNTER — Encounter (HOSPITAL_COMMUNITY)
Admission: EM | Disposition: A | Discharge status: Home / Self Care | Payer: Self-pay | Source: Home / Self Care | Attending: Internal Medicine

## 2014-10-10 ENCOUNTER — Encounter (HOSPITAL_COMMUNITY): Payer: Self-pay | Admitting: Interventional Cardiology

## 2014-10-10 DIAGNOSIS — R7989 Other specified abnormal findings of blood chemistry: Secondary | ICD-10-CM

## 2014-10-10 DIAGNOSIS — N179 Acute kidney failure, unspecified: Secondary | ICD-10-CM

## 2014-10-10 DIAGNOSIS — I2510 Atherosclerotic heart disease of native coronary artery without angina pectoris: Secondary | ICD-10-CM

## 2014-10-10 DIAGNOSIS — R0789 Other chest pain: Secondary | ICD-10-CM | POA: Insufficient documentation

## 2014-10-10 DIAGNOSIS — R778 Other specified abnormalities of plasma proteins: Secondary | ICD-10-CM | POA: Diagnosis present

## 2014-10-10 HISTORY — PX: LEFT HEART CATHETERIZATION WITH CORONARY ANGIOGRAM: SHX5451

## 2014-10-10 LAB — URINALYSIS, ROUTINE W REFLEX MICROSCOPIC
Bilirubin Urine: NEGATIVE
GLUCOSE, UA: NEGATIVE mg/dL
Hgb urine dipstick: NEGATIVE
KETONES UR: NEGATIVE mg/dL
Leukocytes, UA: NEGATIVE
NITRITE: NEGATIVE
PH: 7.5 (ref 5.0–8.0)
Protein, ur: NEGATIVE mg/dL
Specific Gravity, Urine: 1.008 (ref 1.005–1.030)
Urobilinogen, UA: 0.2 mg/dL (ref 0.0–1.0)

## 2014-10-10 LAB — CBC
HEMATOCRIT: 34.5 % — AB (ref 36.0–46.0)
Hemoglobin: 11.2 g/dL — ABNORMAL LOW (ref 12.0–15.0)
MCH: 26.8 pg (ref 26.0–34.0)
MCHC: 32.5 g/dL (ref 30.0–36.0)
MCV: 82.5 fL (ref 78.0–100.0)
Platelets: 306 10*3/uL (ref 150–400)
RBC: 4.18 MIL/uL (ref 3.87–5.11)
RDW: 14.7 % (ref 11.5–15.5)
WBC: 8.4 10*3/uL (ref 4.0–10.5)

## 2014-10-10 LAB — CREATININE, SERUM
Creatinine, Ser: 1.24 mg/dL — ABNORMAL HIGH (ref 0.50–1.10)
GFR calc Af Amer: 63 mL/min — ABNORMAL LOW (ref >90)
GFR, EST NON AFRICAN AMERICAN: 54 mL/min — AB (ref >90)

## 2014-10-10 LAB — SODIUM, URINE, RANDOM: SODIUM UR: 88 mmol/L

## 2014-10-10 LAB — CREATININE, URINE, RANDOM: CREATININE, URINE: 18.24 mg/dL

## 2014-10-10 SURGERY — LEFT HEART CATHETERIZATION WITH CORONARY ANGIOGRAM
Anesthesia: LOCAL

## 2014-10-10 MED ORDER — FENTANYL CITRATE 0.05 MG/ML IJ SOLN
INTRAMUSCULAR | Status: AC
  Filled 2014-10-10: qty 2

## 2014-10-10 MED ORDER — MIDAZOLAM HCL 2 MG/2ML IJ SOLN
INTRAMUSCULAR | Status: AC
  Filled 2014-10-10: qty 2

## 2014-10-10 MED ORDER — SODIUM CHLORIDE 0.9 % IV SOLN
1.0000 mL/kg/h | INTRAVENOUS | Status: DC
  Administered 2014-10-10 – 2014-10-11 (×3): 1 mL/kg/h via INTRAVENOUS

## 2014-10-10 MED ORDER — HEPARIN (PORCINE) IN NACL 2-0.9 UNIT/ML-% IJ SOLN
INTRAMUSCULAR | Status: AC
  Filled 2014-10-10: qty 1000

## 2014-10-10 MED ORDER — LABETALOL HCL 5 MG/ML IV SOLN
INTRAVENOUS | Status: AC
  Filled 2014-10-10: qty 4

## 2014-10-10 MED ORDER — HEPARIN SODIUM (PORCINE) 1000 UNIT/ML IJ SOLN
INTRAMUSCULAR | Status: AC
  Filled 2014-10-10: qty 1

## 2014-10-10 MED ORDER — NITROGLYCERIN 1 MG/10 ML FOR IR/CATH LAB
INTRA_ARTERIAL | Status: AC
  Filled 2014-10-10: qty 10

## 2014-10-10 MED ORDER — AMLODIPINE BESYLATE 10 MG PO TABS
10.0000 mg | ORAL_TABLET | Freq: Every day | ORAL | Status: DC
  Administered 2014-10-11 – 2014-10-16 (×6): 10 mg via ORAL
  Filled 2014-10-10 (×6): qty 1

## 2014-10-10 MED ORDER — SODIUM CHLORIDE 0.9 % IV SOLN: INTRAVENOUS | Status: DC

## 2014-10-10 MED ORDER — METOPROLOL TARTRATE 50 MG PO TABS
50.0000 mg | ORAL_TABLET | Freq: Two times a day (BID) | ORAL | Status: DC
  Administered 2014-10-11 – 2014-10-14 (×8): 50 mg via ORAL
  Filled 2014-10-10 (×8): qty 1

## 2014-10-10 MED ORDER — VERAPAMIL HCL 2.5 MG/ML IV SOLN
INTRAVENOUS | Status: AC
  Filled 2014-10-10: qty 2

## 2014-10-10 MED ORDER — ASPIRIN EC 81 MG PO TBEC
81.0000 mg | DELAYED_RELEASE_TABLET | Freq: Every day | ORAL | Status: DC
  Administered 2014-10-11 – 2014-10-16 (×6): 81 mg via ORAL
  Filled 2014-10-10 (×6): qty 1

## 2014-10-10 MED ORDER — AMLODIPINE BESYLATE 5 MG PO TABS
5.0000 mg | ORAL_TABLET | Freq: Once | ORAL | Status: AC
  Administered 2014-10-10: 5 mg via ORAL
  Filled 2014-10-10: qty 1

## 2014-10-10 MED ORDER — ENALAPRILAT 1.25 MG/ML IV SOLN
1.2500 mg | Freq: Once | INTRAVENOUS | Status: AC
  Administered 2014-10-10: 1.25 mg via INTRAVENOUS
  Filled 2014-10-10: qty 1

## 2014-10-10 MED ORDER — LIDOCAINE HCL (PF) 1 % IJ SOLN
INTRAMUSCULAR | Status: AC
  Filled 2014-10-10: qty 30

## 2014-10-10 MED ORDER — METOPROLOL TARTRATE 25 MG PO TABS
37.5000 mg | ORAL_TABLET | Freq: Once | ORAL | Status: AC
  Administered 2014-10-10: 37.5 mg via ORAL
  Filled 2014-10-10 (×2): qty 1

## 2014-10-10 MED ORDER — LISINOPRIL 10 MG PO TABS
10.0000 mg | ORAL_TABLET | Freq: Every day | ORAL | Status: DC
  Administered 2014-10-10 – 2014-10-11 (×2): 10 mg via ORAL
  Filled 2014-10-10 (×2): qty 1

## 2014-10-10 MED ORDER — ENOXAPARIN SODIUM 40 MG/0.4ML ~~LOC~~ SOLN
40.0000 mg | SUBCUTANEOUS | Status: DC
  Administered 2014-10-11 – 2014-10-15 (×5): 40 mg via SUBCUTANEOUS
  Filled 2014-10-10 (×5): qty 0.4

## 2014-10-10 NOTE — CV Procedure (Addendum)
     Left Heart Catheterization with Coronary Angiography  Report  Riva Edinger  39 y.o.  female 02/18/1976  Procedure Date: 10/10/2014 Referring Physician: Verne Carrowhristopher McAlhany, M.D. Primary Cardiologist: Same  INDICATIONS: Severe hypertension/accelerated with elevated markers and accompanying chest pressure. The studies being done to exclude obstructive coronary disease in a setting where acute coronary syndrome is difficult to exclude.  PROCEDURE: 1. Left heart catheterization; 2. Coronary angiography; 3. Left ventriculography  CONSENT:  The risks, benefits, and details of the procedure were explained in detail to the patient. Risks including death, stroke, heart attack, kidney injury, allergy, limb ischemia, bleeding and radiation injury were discussed.  The patient verbalized understanding and wanted to proceed.  Informed written consent was obtained.  PROCEDURE TECHNIQUE:  After Xylocaine anesthesia a 5 French Slender sheath was placed in the right radial artery with an angiocath and the modified Seldinger technique.  Coronary angiography was done using a 5 F JR4 and JL 3.5 cm diagnostic catheter.  Left ventriculography was done using the JR 4 catheter and hand injection.   Digital images were reviewed. No significant disease was noted. The case was terminated and hemostasis achieved with a wrist band.   CONTRAST:  Total of 85 cc.  COMPLICATIONS:  None   HEMODYNAMICS:  Aortic pressure 154/10 1 mmHg; LV pressure 157/14 mmHg ; LVEDP 27 mmHg  ANGIOGRAPHIC DATA:   The left main coronary artery is normal and bifurcates normally into a large LAD and circumflex..  The left anterior descending artery is very large vessel in caliber and distribution. The LAD wraps around the left ventricular apex. Arch bifurcating first diagonal arises proximally. Beyond the first diagonal there is an eccentric plaque obstructing the vessel by up to 40%. The appearance in this region is also complicated  by tortuosity. I do not believe that there is a significant lesion in the LAD.Marland Kitchen.  The left circumflex artery is normal and gives 3 small obtuse marginal branches..  The right coronary artery is dominant and normal..  LEFT VENTRICULOGRAM:  Left ventricular angiogram was done in the 30 RAO projection and revealed a spade shaped left ventricular cavity. Contractility was normal with an estimated ejection fraction of 55%   IMPRESSIONS:  1. Widely patent coronary arteries. Proximal eccentric region of obstruction/tortuosity with up to 40% narrowing in the proximal to mid LAD. This region was not felt to be hemodynamically significant. 2. Normal left ventricular systolic function with markedly elevated end-diastolic pressures consistent with acute on chronic diastolic heart failure. 3. Elevated troponin related to demand ischemia in the setting of severe left ventricular hypertrophy and elevated left ventricular filling pressures.  RECOMMENDATION:   1. Aggressive risk factor modification including statin therapy and aspirin 2. Aggressive blood pressure management. We will increase metoprolol tartrate 50 mg twice a day and amlodipine to 10 mg per day.  3. The patient will likely need to have a chronic diuretic added to her regimen. I would gauge renal function in a.m. prior to starting diuretic therapy. Diuretic will be necessary for good blood pressure control and will also help to decrease filling pressure and dyspnea. 4.Further titration/addition of antihypertensive regimen depending upon response.

## 2014-10-10 NOTE — Clinical Documentation Improvement (Signed)
  Noted "acute renal insufficiency" in chart. In the Coding world this term equates to "disorder of kidney and/or ureter". If possible please clarify this term to better illustrate severity of illness and risk of mortality.  -- Supporting Information:   - Cr: 1/20 1.21 ,  1/21 1.13   - GFR: 1/20 56/65,  1/21 61/71  -- Poss conditions   - AKI resolving    - AKI   - Acute renal failure   - Other condition  Thank You,  Clide CliffLaurie Nayelis Bonito RN [facility] [phone] HIM department

## 2014-10-10 NOTE — H&P (View-Only) (Signed)
Called by nursing this afternoon. Pt with onset of chest pressure. Mostly relieved with 2 SL NTG. NTG drip has been started. She has continued mild chest discomfort at this time. Echo with severe LVH c/w with Hypertensive heart disease. Given her rising troponin and chest pain despite better control of BP, will cancel stress test and plan cardiac cath with possible PCI.  I have personally reviewed the procedure with the patient including risks and benefits and she agrees to proceed. [facility] Katrinka BlazingSmith. Transport in am via [facility].   Cath orders placed. NPO at midnight.   MCALHANY,CHRISTOPHER 3:40 PM 10/09/2014

## 2014-10-10 NOTE — Progress Notes (Signed)
[facility] PROGRESS NOTE  Theresa Soto ZOX:[id number] DOB: 1976/08/28 DOA: 10/08/2014 PCP: No PCP Per Patient  Assessment/Plan: 1. Hypertensive urgency: improved.  Resume medications.   Chest pain: Worsened last night and shew as taken to cardiac cath this showed 40 % narrowing of the LAD. Medical management for now.  Check lipid panel and hgba1c. Resume aspirin.   Mild anemia: Continue to monitor.    Mild renal insufficiency: Unclear etiology UA and urine studies ordered and pending.    DVT prophylaxis.   Code Status: full code.  Family Communication: none at bedside Disposition Plan: pending.    Consultants:  Cardiology   Procedures:  Cardiac cath 1/22  Antibiotics:    HPI/Subjective: reprots having a headache, but no chest pain.   Objective: Filed Vitals:   10/10/14 1437  BP: 159/87  Pulse:   Temp:   Resp: 17    Intake/Output Summary (Last 24 hours) at 10/10/14 1630 Last data filed at 10/10/14 0600  Gross per 24 hour  Intake    692 ml  Output    500 ml  Net    192 ml   Filed Weights   10/08/14 1957 10/09/14 1851 10/10/14 0400  Weight: 124.4 kg (274 lb 4 oz) 118.343 kg (260 lb 14.4 oz) 118.298 kg (260 lb 12.8 oz)    Exam:   General:  SLEEPY after the cath. But oriented  Cardiovascular: s1s2  Respiratory: diminished air entry at bases. No wheezing or rhonchi  Abdomen: soft non tender non distended bowel sounds heard  Musculoskeletal: trace pedal edema.   Data Reviewed: Basic Metabolic Panel:  Recent Labs Lab 10/08/14 1343 10/09/14 0550 10/10/14 1210  NA 138 134*  --   K 3.2* 3.5  --   CL 101 101  --   CO2 28 25  --   GLUCOSE 130* 104*  --   BUN 16 15  --   CREATININE 1.21* 1.13* 1.24*  CALCIUM 9.4 8.8  --    Liver Function Tests:  Recent Labs Lab 10/09/14 0550  AST 23  ALT 17  ALKPHOS 45  BILITOT 0.6  PROT 7.3  ALBUMIN 3.7   No results for input(s): LIPASE, AMYLASE in the last 168 hours. No results  for input(s): AMMONIA in the last 168 hours. CBC:  Recent Labs Lab 10/08/14 1343 10/09/14 0550 10/10/14 1210  WBC 6.3 9.6 8.4  HGB 13.3 12.7 11.2*  HCT 40.6 39.0 34.5*  MCV 82.9 82.8 82.5  PLT 317 308 306   Cardiac Enzymes:  Recent Labs Lab 10/08/14 2319 10/09/14 0550 10/09/14 1000 10/09/14 1335 10/09/14 2016  TROPONINI <0.03 0.15* 0.24* 0.28* 0.24*   BNP (last 3 results) No results for input(s): PROBNP in the last 8760 hours. CBG: No results for input(s): GLUCAP in the last 168 hours.  Recent Results (from the past 240 hour(s))  MRSA PCR Screening     Status: None   Collection Time: 10/08/14  7:55 PM  Result Value Ref Range Status   MRSA by PCR NEGATIVE NEGATIVE Final    Comment:        The GeneXpert MRSA Assay (FDA approved for NASAL specimens only), is one component of a comprehensive MRSA colonization surveillance program. It is not intended to diagnose MRSA infection nor to guide or monitor treatment for MRSA infections.      Studies: Mr Brain Wo Contrast  10/08/2014   CLINICAL DATA:  Progressive headaches with shortness of breath.  EXAM: MRI HEAD WITHOUT CONTRAST  TECHNIQUE: Multiplanar,  multiecho pulse sequences of the brain and surrounding structures were obtained without intravenous contrast.  COMPARISON:  CT head without contrast 10/08/2014  FINDINGS: Moderate generalized atrophy is present. Confluent periventricular shin white matter changes are present bilaterally with other scattered subcortical T2 changes is well. The corpus callosum is intact. No acute infarct, hemorrhage, or mass lesion is present. The ventricles are proportionate to the degree of atrophy. No significant extra-axial fluid collections are present. Mild prominence of the extra-axial CSF is due to atrophy.  Flow is present in the major intracranial arteries. The globes and orbits are intact. The paranasal sinuses are clear. A right mastoid effusion is evident. No obstructing  nasopharyngeal lesion is present.  IMPRESSION: 1. Moderate generalized atrophy. This may be related to chronic microvascular ischemic changes. 2. Confluent periventricular and subcortical white matter changes bilaterally. The differential diagnosis include severe microvascular changes, markedly premature for age, versus a demyelinating process. 3. Prominent right mastoid effusion. No obstructing nasopharyngeal lesion is evident.   Electronically Signed   By: Gennette Pac M.D.   On: 10/08/2014 19:32    Scheduled Meds: . [START ON 10/11/2014] amLODipine  10 mg Oral Daily  . [START ON 10/11/2014] aspirin EC  81 mg Oral Daily  . ciprofloxacin-dexamethasone  4 drop Right Ear BID  . docusate sodium  50 mg Oral Daily  . [START ON 10/11/2014] enoxaparin (LOVENOX) injection  40 mg Subcutaneous Q24H  . metoprolol tartrate  50 mg Oral BID  . polyethylene glycol  17 g Oral Daily  . sodium chloride  3 mL Intravenous Q12H   Continuous Infusions: . sodium chloride 1 mL/kg/hr (10/10/14 1151)  . sodium chloride      Active Problems:   Hypertensive urgency   AKI (acute kidney injury)   Elevated troponin I level    Time spent: 25 MIN.    [facility]  [facility] Pager [phone] If 7PM-7AM, please contact night-coverage at [email or web address], password [facility] 10/10/2014, 4:30 PM  LOS: 2 days

## 2014-10-10 NOTE — Interval H&P Note (Signed)
Cath Lab Visit (complete for each Cath Lab visit)  Clinical Evaluation Leading to the Procedure:   ACS: Yes.    Non-ACS:    Anginal Classification: CCS III  Anti-ischemic medical therapy: Maximal Therapy (2 or more classes of medications)  Non-Invasive Test Results: No non-invasive testing performed  Prior CABG: No previous CABG      History and Physical Interval Note:  10/10/2014 10:22 AM  Theresa Soto  has presented today for surgery, with the diagnosis of cp  The various methods of treatment have been discussed with the patient and family. After consideration of risks, benefits and other options for treatment, the patient has consented to  Procedure(s): LEFT HEART CATHETERIZATION WITH CORONARY ANGIOGRAM (N/A) as a surgical intervention .  The patient's history has been reviewed, patient examined, no change in status, stable for surgery.  I have reviewed the patient's chart and labs.  Questions were answered to the patient's satisfaction.     Lesleigh NoeSMITH III,HENRY W

## 2014-10-10 NOTE — Significant Event (Signed)
Filed Vitals:   10/10/14 2043  BP: 206/112  Pulse:   Temp: 98.1 F (36.7 C)  Resp: 18   I would call from normal vital signs with severe hypertension, patient complained of headache. After reviewing her case lisinopril 10 mg by mouth daily and Vasotec 1 IV ordered We'll reassess

## 2014-10-11 DIAGNOSIS — R079 Chest pain, unspecified: Secondary | ICD-10-CM

## 2014-10-11 DIAGNOSIS — R7989 Other specified abnormal findings of blood chemistry: Secondary | ICD-10-CM

## 2014-10-11 LAB — HEMOGLOBIN A1C
HEMOGLOBIN A1C: 6.2 % — AB (ref <5.7)
MEAN PLASMA GLUCOSE: 131 mg/dL — AB (ref <117)

## 2014-10-11 LAB — BASIC METABOLIC PANEL
Anion gap: 11 (ref 5–15)
BUN: 8 mg/dL (ref 6–23)
CHLORIDE: 102 mmol/L (ref 96–112)
CO2: 21 mmol/L (ref 19–32)
CREATININE: 0.98 mg/dL (ref 0.50–1.10)
Calcium: 8.8 mg/dL (ref 8.4–10.5)
GFR calc non Af Amer: 72 mL/min — ABNORMAL LOW (ref >90)
GFR, EST AFRICAN AMERICAN: 84 mL/min — AB (ref >90)
GLUCOSE: 148 mg/dL — AB (ref 70–99)
Potassium: 3.7 mmol/L (ref 3.5–5.1)
Sodium: 134 mmol/L — ABNORMAL LOW (ref 135–145)

## 2014-10-11 LAB — LIPID PANEL
CHOLESTEROL: 151 mg/dL (ref 0–200)
HDL: 62 mg/dL (ref >39)
LDL Cholesterol: 66 mg/dL (ref 0–99)
TRIGLYCERIDES: 116 mg/dL (ref <150)
Total CHOL/HDL Ratio: 2.4 RATIO
VLDL: 23 mg/dL (ref 0–40)

## 2014-10-11 MED ORDER — LISINOPRIL 20 MG PO TABS
20.0000 mg | ORAL_TABLET | Freq: Every day | ORAL | Status: DC
  Administered 2014-10-12 – 2014-10-16 (×5): 20 mg via ORAL
  Filled 2014-10-11 (×5): qty 1

## 2014-10-11 MED ORDER — HYDROCHLOROTHIAZIDE 25 MG PO TABS
25.0000 mg | ORAL_TABLET | Freq: Every day | ORAL | Status: DC
  Administered 2014-10-11 – 2014-10-13 (×3): 25 mg via ORAL
  Filled 2014-10-11 (×3): qty 1

## 2014-10-11 MED ORDER — LISINOPRIL 10 MG PO TABS
10.0000 mg | ORAL_TABLET | Freq: Every day | ORAL | Status: DC
  Administered 2014-10-11: 10 mg via ORAL
  Filled 2014-10-11: qty 1

## 2014-10-11 NOTE — Progress Notes (Signed)
[facility] PROGRESS NOTE  Theresa Soto ZOX:[id number] DOB: 11-21-1975 DOA: 10/08/2014 PCP: No PCP Per Patient  Assessment/Plan: 1. Hypertensive urgency: her bp worsened over night and she was restarted on NTG gtt.  Her headache improved this am. Resume norvasc HCTZ, metoprolol and lisinopril. Have increased the dose of lisinopril.   Chest pain: Resolved. She underwent left heart cardiac cath with angiography and ventriculography,  showed widely patent coronary arteries. It also showed 40 % narrowing of the proximal to mid LAD. Medical management for now.  Lipid panel showed LDL of 66, and hgba1c is pending. Marland Kitchen Resume aspirin.   Mild anemia: Continue to monitor.    Mild renal insufficiency: Unclear etiology UA and urine studies ordered and pending.  Resolved with hydration.   Elevated troponin related to demand ischemia    DVT prophylaxis.   Code Status: full code.  Family Communication: none at bedside Disposition Plan: pending.    Consultants:  Cardiology   Procedures:  Cardiac cath 1/22  Antibiotics: none HPI/Subjective: Headache is better, nausea has improved. Able to eat some breakfast.   Objective: Filed Vitals:   10/11/14 0742  BP: 162/95  Pulse: 84  Temp: 98.3 F (36.8 C)  Resp: 18    Intake/Output Summary (Last 24 hours) at 10/11/14 1029 Last data filed at 10/11/14 1026  Gross per 24 hour  Intake 2257.9 ml  Output   4250 ml  Net -1992.1 ml   Filed Weights   10/09/14 1851 10/10/14 0400 10/11/14 0542  Weight: 118.343 kg (260 lb 14.4 oz) 118.298 kg (260 lb 12.8 oz) 118.162 kg (260 lb 8 oz)    Exam:   General: ALERT afebrile not in distress, reports some headache but much improved from yesterday.   Cardiovascular: s1s2  Respiratory: diminished air entry at bases. No wheezing or rhonchi  Abdomen: soft non tender non distended bowel sounds heard  Musculoskeletal: trace pedal edema.   Data Reviewed: Basic Metabolic  Panel:  Recent Labs Lab 10/08/14 1343 10/09/14 0550 10/10/14 1210 10/11/14 0311  NA 138 134*  --  134*  K 3.2* 3.5  --  3.7  CL 101 101  --  102  CO2 28 25  --  21  GLUCOSE 130* 104*  --  148*  BUN 16 15  --  8  CREATININE 1.21* 1.13* 1.24* 0.98  CALCIUM 9.4 8.8  --  8.8   Liver Function Tests:  Recent Labs Lab 10/09/14 0550  AST 23  ALT 17  ALKPHOS 45  BILITOT 0.6  PROT 7.3  ALBUMIN 3.7   No results for input(s): LIPASE, AMYLASE in the last 168 hours. No results for input(s): AMMONIA in the last 168 hours. CBC:  Recent Labs Lab 10/08/14 1343 10/09/14 0550 10/10/14 1210  WBC 6.3 9.6 8.4  HGB 13.3 12.7 11.2*  HCT 40.6 39.0 34.5*  MCV 82.9 82.8 82.5  PLT 317 308 306   Cardiac Enzymes:  Recent Labs Lab 10/08/14 2319 10/09/14 0550 10/09/14 1000 10/09/14 1335 10/09/14 2016  TROPONINI <0.03 0.15* 0.24* 0.28* 0.24*   BNP (last 3 results) No results for input(s): PROBNP in the last 8760 hours. CBG: No results for input(s): GLUCAP in the last 168 hours.  Recent Results (from the past 240 hour(s))  MRSA PCR Screening     Status: None   Collection Time: 10/08/14  7:55 PM  Result Value Ref Range Status   MRSA by PCR NEGATIVE NEGATIVE Final    Comment:  The GeneXpert MRSA Assay (FDA approved for NASAL specimens only), is one component of a comprehensive MRSA colonization surveillance program. It is not intended to diagnose MRSA infection nor to guide or monitor treatment for MRSA infections.      Studies: No results found.  Scheduled Meds: . amLODipine  10 mg Oral Daily  . aspirin EC  81 mg Oral Daily  . ciprofloxacin-dexamethasone  4 drop Right Ear BID  . docusate sodium  50 mg Oral Daily  . enoxaparin (LOVENOX) injection  40 mg Subcutaneous Q24H  . hydrochlorothiazide  25 mg Oral Daily  . lisinopril  10 mg Oral Daily  . metoprolol tartrate  50 mg Oral BID  . polyethylene glycol  17 g Oral Daily  . sodium chloride  3 mL Intravenous  Q12H   Continuous Infusions: . sodium chloride 1 mL/kg/hr (10/11/14 0455)    Active Problems:   Hypertensive urgency   AKI (acute kidney injury)   Elevated troponin I level   Other chest pain    Time spent: 25 MIN.    [facility] SpringKULA,Kaiesha Tonner  [facility] Pager [phone] If 7PM-7AM, please contact night-coverage at [email or web address], password [email or web address] 10/11/2014, 10:29 AM  LOS: 3 days

## 2014-10-11 NOTE — Progress Notes (Signed)
SUBJECTIVE:  No chest pain this AM.  No acute SOB   PHYSICAL EXAM Filed Vitals:   10/10/14 2322 10/11/14 0021 10/11/14 0542 10/11/14 0742  BP: 140/71 138/66 171/102 162/95  Pulse: 95 86 90 84  Temp:  98.7 F (37.1 C) 98.6 F (37 C) 98.3 F (36.8 C)  TempSrc:  Oral Oral Oral  Resp: 18 18 17 18   Height:      Weight:   260 lb 8 oz (118.162 kg)   SpO2: 93% 94% 96% 98%   General:  No distress Lungs:  Clear Heart:  RRR Abdomen:  Positive bowel sounds, no rebound no guarding Extremities:  Right wrist OK.  No edema   LABS:  Results for orders placed or performed during the Soto encounter of 10/08/14 (from the past 24 hour(s))  CBC     Status: Abnormal   Collection Time: 10/10/14 12:10 PM  Result Value Ref Range   WBC 8.4 4.0 - 10.5 K/uL   RBC 4.18 3.87 - 5.11 MIL/uL   Hemoglobin 11.2 (L) 12.0 - 15.0 g/dL   HCT 30.834.5 (L) 65.736.0 - 84.646.0 %   MCV 82.5 78.0 - 100.0 fL   MCH 26.8 26.0 - 34.0 pg   MCHC 32.5 30.0 - 36.0 g/dL   RDW 96.214.7 95.211.5 - 84.115.5 %   Platelets 306 150 - 400 K/uL  Creatinine, serum     Status: Abnormal   Collection Time: 10/10/14 12:10 PM  Result Value Ref Range   Creatinine, Ser 1.24 (H) 0.50 - 1.10 mg/dL   GFR calc non Af Amer 54 (L) >90 mL/min   GFR calc Af Amer 63 (L) >90 mL/min  Urinalysis, Routine w reflex microscopic     Status: None   Collection Time: 10/10/14  8:55 PM  Result Value Ref Range   Color, Urine YELLOW YELLOW   APPearance CLEAR CLEAR   Specific Gravity, Urine 1.008 1.005 - 1.030   pH 7.5 5.0 - 8.0   Glucose, UA NEGATIVE NEGATIVE mg/dL   Hgb urine dipstick NEGATIVE NEGATIVE   Bilirubin Urine NEGATIVE NEGATIVE   Ketones, ur NEGATIVE NEGATIVE mg/dL   Protein, ur NEGATIVE NEGATIVE mg/dL   Urobilinogen, UA 0.2 0.0 - 1.0 mg/dL   Nitrite NEGATIVE NEGATIVE   Leukocytes, UA NEGATIVE NEGATIVE  Creatinine, urine, random     Status: None   Collection Time: 10/10/14  8:55 PM  Result Value Ref Range   Creatinine, Urine 18.24 mg/dL  Sodium,  urine, random     Status: None   Collection Time: 10/10/14  8:55 PM  Result Value Ref Range   Sodium, Ur 88 mmol/L  Basic metabolic panel     Status: Abnormal   Collection Time: 10/11/14  3:11 AM  Result Value Ref Range   Sodium 134 (L) 135 - 145 mmol/L   Potassium 3.7 3.5 - 5.1 mmol/L   Chloride 102 96 - 112 mmol/L   CO2 21 19 - 32 mmol/L   Glucose, Bld 148 (H) 70 - 99 mg/dL   BUN 8 6 - 23 mg/dL   Creatinine, Ser 3.240.98 0.50 - 1.10 mg/dL   Calcium 8.8 8.4 - 40.110.5 mg/dL   GFR calc non Af Amer 72 (L) >90 mL/min   GFR calc Af Amer 84 (L) >90 mL/min   Anion gap 11 5 - 15  Lipid panel     Status: None   Collection Time: 10/11/14  3:11 AM  Result Value Ref Range   Cholesterol 151 0 -  200 mg/dL   Triglycerides 161 <096 mg/dL   HDL 62 >04 mg/dL   Total CHOL/HDL Ratio 2.4 RATIO   VLDL 23 0 - 40 mg/dL   LDL Cholesterol 66 0 - 99 mg/dL    Intake/Output Summary (Last 24 hours) at 10/11/14 0959 Last data filed at 10/11/14 0827  Gross per 24 hour  Intake 2257.9 ml  Output   3450 ml  Net -1192.1 ml     ASSESSMENT AND PLAN:   CAD:  LAD 40% stenosis.  Elevated troponin was thought to be demand ischemia secondary to LVH.   Medical management as below.   HTN:  Metoprolol and Norvasc dose increased yesterday.   Per Dr. Michaelle Copas suggestion, I added HCTZ. She might need spiro added as well later and this can be a combination pill.  BP is still up.    Theresa Soto 10/11/2014 9:59 AM

## 2014-10-12 MED ORDER — HYDRALAZINE HCL 25 MG PO TABS
25.0000 mg | ORAL_TABLET | Freq: Three times a day (TID) | ORAL | Status: DC
  Administered 2014-10-12 – 2014-10-16 (×11): 25 mg via ORAL
  Filled 2014-10-12 (×11): qty 1

## 2014-10-12 MED ORDER — SPIRONOLACTONE 25 MG PO TABS
25.0000 mg | ORAL_TABLET | Freq: Every day | ORAL | Status: DC
  Administered 2014-10-12 – 2014-10-16 (×5): 25 mg via ORAL
  Filled 2014-10-12 (×5): qty 1

## 2014-10-12 NOTE — Progress Notes (Signed)
    SUBJECTIVE:  Mild chest tightness at times and she feels like she has to stop to take a deep breath while talking.   PHYSICAL EXAM Filed Vitals:   10/11/14 2025 10/12/14 0030 10/12/14 0619 10/12/14 0738  BP: 182/111 161/99 164/98 190/116  Pulse: 75 73 71 77  Temp: 98.2 F (36.8 C) 97.7 F (36.5 C) 98.2 F (36.8 C) 98.1 F (36.7 C)  TempSrc: Oral Oral Oral Oral  Resp: 18 18 18 19   Height:      Weight:   257 lb 8 oz (116.8 kg)   SpO2: 100% 100% 99% 100%   General:  No distress Lungs:  Clear Heart:  RRR Abdomen:  Positive bowel sounds, no rebound no guarding Extremities:  Right wrist OK.  No edema   LABS:  No results found for this or any previous visit (from the past 24 hour(s)).  Intake/Output Summary (Last 24 hours) at 10/12/14 0804 Last data filed at 10/12/14 [id number]  Gross per 24 hour  Intake    720 ml  Output   4600 ml  Net  -3880 ml     ASSESSMENT AND PLAN:   CAD:  LAD 40% stenosis.  Elevated troponin was thought to be demand ischemia secondary to LVH.   Medical management as below.   HTN:  Metoprolol and Norvasc dose increased.    I added HCTZ yesterday. BP is still up.  Add spironolactone.   Given the combo of ACE inhibitor and spironolactone she will need to have potassium watched closely.    Fayrene FearingJames Good [facility]ar[facility] Cent[facility]chrein 10/12/2014 8:04 AM

## 2014-10-12 NOTE — Progress Notes (Signed)
[facility] PROGRESS NOTE  Theresa Soto WGN:[id number] DOB: 07/12/1976 DOA: 10/08/2014 PCP: No PCP Per Patient  Assessment/Plan: 1. Hypertensive urgency: her bp has plateau ed.  Her headache improved this am. Resume norvasc HCTZ, metoprolol and lisinopril. Have increased the dose of lisinopril. Added on hydralazine.   Chest pain: Resolved. She underwent left heart cardiac cath with angiography and ventriculography,  showed widely patent coronary arteries. It also showed 40 % narrowing of the proximal to mid LAD. Medical management for now.  Lipid panel showed LDL of 66, and hgba1c is 6.2, she is borderline diabetic . Marland Kitchen. Resume aspirin.   Mild anemia: Continue to monitor.    Mild renal insufficiency: Unclear etiology UA and urine studies ordered and pending.  Resolved with hydration.   Elevated troponin related to demand ischemia    DVT prophylaxis.   Code Status: full code.  Family Communication: none at bedside Disposition Plan: pending.    Consultants:  Cardiology   Procedures:  Cardiac cath 1/22  Antibiotics: none HPI/Subjective: Headache is better, nausea has improved. Able to eat some breakfast. In good spirits.   Objective: Filed Vitals:   10/12/14 1624  BP: 185/115  Pulse: 77  Temp: 98 F (36.7 C)  Resp: 19    Intake/Output Summary (Last 24 hours) at 10/12/14 1951 Last data filed at 10/12/14 1742  Gross per 24 hour  Intake    720 ml  Output   5700 ml  Net  -4980 ml   Filed Weights   10/10/14 0400 10/11/14 0542 10/12/14 0619  Weight: 118.298 kg (260 lb 12.8 oz) 118.162 kg (260 lb 8 oz) 116.8 kg (257 lb 8 oz)    Exam:   General: ALERT afebrile not in distress, reports some headache but much improved from yesterday.   Cardiovascular: s1s2  Respiratory: diminished air entry at bases. No wheezing or rhonchi  Abdomen: soft non tender non distended bowel sounds heard  Musculoskeletal: trace pedal edema.   Data Reviewed: Basic  Metabolic Panel:  Recent Labs Lab 10/08/14 1343 10/09/14 0550 10/10/14 1210 10/11/14 0311  NA 138 134*  --  134*  K 3.2* 3.5  --  3.7  CL 101 101  --  102  CO2 28 25  --  21  GLUCOSE 130* 104*  --  148*  BUN 16 15  --  8  CREATININE 1.21* 1.13* 1.24* 0.98  CALCIUM 9.4 8.8  --  8.8   Liver Function Tests:  Recent Labs Lab 10/09/14 0550  AST 23  ALT 17  ALKPHOS 45  BILITOT 0.6  PROT 7.3  ALBUMIN 3.7   No results for input(s): LIPASE, AMYLASE in the last 168 hours. No results for input(s): AMMONIA in the last 168 hours. CBC:  Recent Labs Lab 10/08/14 1343 10/09/14 0550 10/10/14 1210  WBC 6.3 9.6 8.4  HGB 13.3 12.7 11.2*  HCT 40.6 39.0 34.5*  MCV 82.9 82.8 82.5  PLT 317 308 306   Cardiac Enzymes:  Recent Labs Lab 10/08/14 2319 10/09/14 0550 10/09/14 1000 10/09/14 1335 10/09/14 2016  TROPONINI <0.03 0.15* 0.24* 0.28* 0.24*   BNP (last 3 results) No results for input(s): PROBNP in the last 8760 hours. CBG: No results for input(s): GLUCAP in the last 168 hours.  Recent Results (from the past 240 hour(s))  MRSA PCR Screening     Status: None   Collection Time: 10/08/14  7:55 PM  Result Value Ref Range Status   MRSA by PCR NEGATIVE NEGATIVE Final  Comment:        The GeneXpert MRSA Assay (FDA approved for NASAL specimens only), is one component of a comprehensive MRSA colonization surveillance program. It is not intended to diagnose MRSA infection nor to guide or monitor treatment for MRSA infections.      Studies: No results found.  Scheduled Meds: . amLODipine  10 mg Oral Daily  . aspirin EC  81 mg Oral Daily  . ciprofloxacin-dexamethasone  4 drop Right Ear BID  . docusate sodium  50 mg Oral Daily  . enoxaparin (LOVENOX) injection  40 mg Subcutaneous Q24H  . hydrALAZINE  25 mg Oral 3 times per day  . hydrochlorothiazide  25 mg Oral Daily  . lisinopril  20 mg Oral Daily  . metoprolol tartrate  50 mg Oral BID  . polyethylene glycol   17 g Oral Daily  . sodium chloride  3 mL Intravenous Q12H  . spironolactone  25 mg Oral Daily   Continuous Infusions: . sodium chloride 1 mL/kg/hr (10/11/14 2312)    Active Problems:   Hypertensive urgency   AKI (acute kidney injury)   Elevated troponin I level   Other chest pain   Pain in the chest    Time spent: 25 MIN.    [facility]  [facility] Pager [phone] If 7PM-7AM, please contact night-coverage at [email or web address], password [facility] 10/12/2014, 7:51 PM  LOS: 4 days

## 2014-10-13 DIAGNOSIS — R0789 Other chest pain: Secondary | ICD-10-CM

## 2014-10-13 DIAGNOSIS — E282 Polycystic ovarian syndrome: Secondary | ICD-10-CM

## 2014-10-13 LAB — BASIC METABOLIC PANEL
Anion gap: 5 (ref 5–15)
BUN: 12 mg/dL (ref 6–23)
CO2: 30 mmol/L (ref 19–32)
Calcium: 8.8 mg/dL (ref 8.4–10.5)
Chloride: 102 mmol/L (ref 96–112)
Creatinine, Ser: 1.16 mg/dL — ABNORMAL HIGH (ref 0.50–1.10)
GFR calc non Af Amer: 59 mL/min — ABNORMAL LOW (ref >90)
GFR, EST AFRICAN AMERICAN: 68 mL/min — AB (ref >90)
Glucose, Bld: 106 mg/dL — ABNORMAL HIGH (ref 70–99)
POTASSIUM: 3.5 mmol/L (ref 3.5–5.1)
SODIUM: 137 mmol/L (ref 135–145)

## 2014-10-13 LAB — MAGNESIUM: Magnesium: 2.1 mg/dL (ref 1.5–2.5)

## 2014-10-13 NOTE — Progress Notes (Signed)
Subjective: Reports some right sided CP toward the shoulder.    Objective: Vital signs in last 24 hours: Temp:  [98 F (36.7 C)-98.4 F (36.9 C)] 98.4 F (36.9 C) (01/25 0800) Pulse Rate:  [66-77] 74 (01/25 0800) Resp:  [17-19] 18 (01/25 0800) BP: (158-185)/(98-117) 175/111 mmHg (01/25 0812) SpO2:  [92 %-100 %] 98 % (01/25 0800) Weight:  [258 lb 6.4 oz (117.209 kg)] 258 lb 6.4 oz (117.209 kg) (01/25 0400) Last BM Date: 10/07/14  Intake/Output from previous day: 01/24 0701 - 01/25 0700 In: 960 [P.O.:960] Out: 3800 [Urine:3800] Intake/Output this shift:    Medications Current Facility-Administered Medications  Medication Dose Route Frequency Provider Last Rate Last Dose  . 0.9 %  sodium chloride infusion  1 mL/kg/hr Intravenous Continuous Lyn Records III, MD 118.3 mL/hr at 10/11/14 2312 1 mL/kg/hr at 10/11/14 2312  . acetaminophen (TYLENOL) tablet 650 mg  650 mg Oral Q6H PRN Belkys A Regalado, MD   650 mg at 10/11/14 2046   Or  . acetaminophen (TYLENOL) suppository 650 mg  650 mg Rectal Q6H PRN Belkys A Regalado, MD      . amLODipine (NORVASC) tablet 10 mg  10 mg Oral Daily Lyn Records III, MD   10 mg at 10/13/14 0813  . aspirin EC tablet 81 mg  81 mg Oral Daily Lyn Records III, MD   81 mg at 10/13/14 0813  . ciprofloxacin-dexamethasone (CIPRODEX) 0.3-0.1 % otic suspension 4 drop  4 drop Right Ear BID Belkys A Regalado, MD   4 drop at 10/12/14 2300  . docusate sodium (COLACE) capsule 50 mg  50 mg Oral Daily Haydee Salter, MD   50 mg at 10/12/14 0905  . enoxaparin (LOVENOX) injection 40 mg  40 mg Subcutaneous Q24H Lyn Records III, MD   40 mg at 10/13/14 0805  . hydrALAZINE (APRESOLINE) injection 10 mg  10 mg Intravenous Q6H PRN Belkys A Regalado, MD   10 mg at 10/10/14 1752  . hydrALAZINE (APRESOLINE) tablet 25 mg  25 mg Oral 3 times per day Kathlen Mody, MD   25 mg at 10/13/14 9604  . hydrochlorothiazide (HYDRODIURIL) tablet 25 mg  25 mg Oral Daily Rollene Rotunda, MD   25  mg at 10/13/14 0816  . HYDROcodone-acetaminophen (NORCO/VICODIN) 5-325 MG per tablet 1-2 tablet  1-2 tablet Oral Q4H PRN Alba Cory, MD   2 tablet at 10/13/14 0805  . lisinopril (PRINIVIL,ZESTRIL) tablet 20 mg  20 mg Oral Daily Kathlen Mody, MD   20 mg at 10/13/14 5409  . metoprolol (LOPRESSOR) tablet 50 mg  50 mg Oral BID Lyn Records III, MD   50 mg at 10/13/14 0813  . morphine 2 MG/ML injection 1 mg  1 mg Intravenous Q4H PRN Belkys A Regalado, MD   1 mg at 10/11/14 2312  . nitroGLYCERIN (NITROSTAT) SL tablet 0.4 mg  0.4 mg Sublingual Q5 min PRN Belkys A Regalado, MD   0.4 mg at 10/09/14 1204  . ondansetron (ZOFRAN) tablet 4 mg  4 mg Oral Q6H PRN Belkys A Regalado, MD   4 mg at 10/12/14 0730   Or  . ondansetron (ZOFRAN) injection 4 mg  4 mg Intravenous Q6H PRN Belkys A Regalado, MD   4 mg at 10/10/14 2039  . polyethylene glycol (MIRALAX / GLYCOLAX) packet 17 g  17 g Oral Daily Haydee Salter, MD   17 g at 10/13/14 8119  . sodium chloride 0.9 % injection 3 mL  3 mL Intravenous Q12H Belkys A Regalado, MD   3 mL at 10/11/14 2200  . sodium chloride 0.9 % injection 3 mL  3 mL Intravenous PRN Belkys A Regalado, MD      . spironolactone (ALDACTONE) tablet 25 mg  25 mg Oral Daily Rollene RotundaJames Hochrein, MD   25 mg at 10/13/14 0813    PE: General appearance: alert, cooperative and no distress Lungs: Decreased BS bilaterally but no rales or wheezing Heart: regular rate and rhythm, S1, S2 normal, no murmur, click, rub or gallop Extremities: No LEE Pulses: 2+ and symmetric Skin: Warm and dry Neurologic: Grossly normal  Lab Results:   Recent Labs  10/10/14 1210  WBC 8.4  HGB 11.2*  HCT 34.5*  PLT 306   BMET  Recent Labs  10/10/14 1210 10/11/14 0311 10/13/14 0440  NA  --  134* 137  K  --  3.7 3.5  CL  --  102 102  CO2  --  21 30  GLUCOSE  --  148* 106*  BUN  --  8 12  CREATININE 1.24* 0.98 1.16*  CALCIUM  --  8.8 8.8   PT/INR No results for input(s): LABPROT, INR in the last 72  hours. Cholesterol  Recent Labs  10/11/14 0311  CHOL 151   Lipid Panel     Component Value Date/Time   CHOL 151 10/11/2014 0311   TRIG 116 10/11/2014 0311   HDL 62 10/11/2014 0311   CHOLHDL 2.4 10/11/2014 0311   VLDL 23 10/11/2014 0311   LDLCALC 66 10/11/2014 0311    Assessment/Plan    Hypertensive urgency Amlodipine 10, hydralazine 25Q8, HCTZ 25, lisinopril 20, lopressor 50 BID, spironolactone 25.   Med changes made yesterday.  I would monitor today and make additional adjustments later or in the morning.  K WNL.  Net fluids: -2.8L-7.5L .   AKI (acute kidney injury)  Slight increase in SCr today.  May ne to back off on HCTZ or spironolactone.  Monitor.   Elevated troponin I level  Troponin peaked at 0.28.  SP Left heart cath reveling 40% LAD.  Thought to be demand ischemia.     Pain in the chest  Noncardiac    LOS: 5 days    HAGER, BRYAN PA-C 10/13/2014 9:07 AM   Agree with note written by Jones SkeneBryan Hagar [facility]  Pt admitted for HTN urgency, CP/SOB. She has PCOS and her HTN is probably related to this. Had HA, CT head neg. 2D shows nl LV FXN with severe concentric LVH c/w chronic untreated HTN. She has diuresed and has been placed on mult anti HTN meds. Exam benign. Labs otherwise OK. Renal stable. Can consolidate meds and change hydralazine to BiDil. Continue BB, CCB, HCTZ and spironolactone. Will follow with you.   Runell GessBERRY,Alianny Toelle J 10/13/2014 9:37 AM

## 2014-10-13 NOTE — Progress Notes (Signed)
[facility] PROGRESS NOTE  Theresa Soto WJX:[id number] DOB: [birth year] DOA: 10/08/2014 PCP: No PCP Per Patient  Assessment/Plan: 1. Hypertensive urgency: her bp has plateau ed.  Her headache improved this am. Resume norvasc HCTZ, metoprolol and lisinopril. Have increased the dose of lisinopril. Added on hydralazine. Her bp parameters are improving.   Chest pain: Resolved. She underwent left heart cardiac cath with angiography and ventriculography,  showed widely patent coronary arteries. It also showed 40 % narrowing of the proximal to mid LAD. Medical management for now.  Lipid panel showed LDL of 66, and hgba1c is 6.2, she is borderline diabetic . Marland Kitchen Resume aspirin.   Mild anemia: Continue to monitor.    Mild renal insufficiency: Unclear etiology UA and urine studies ordered and pending.  Will repeat renal parameters in am.    Elevated troponin related to demand ischemia    DVT prophylaxis.   Code Status: full code.  Family Communication: none at bedside Disposition Plan: pending.    Consultants:  Cardiology   Procedures:  Cardiac cath 1/22  Antibiotics: none HPI/Subjective: Headache is better, nausea has improved. Able to eat some breakfast. In good spirits.  Walked a little today.   Objective: Filed Vitals:   10/13/14 1319  BP: 160/98  Pulse: 71  Temp: 98.2 F (36.8 C)  Resp:     Intake/Output Summary (Last 24 hours) at 10/13/14 1841 Last data filed at 10/13/14 1800  Gross per 24 hour  Intake    720 ml  Output   1600 ml  Net   -880 ml   Filed Weights   10/11/14 0542 10/12/14 0619 10/13/14 0400  Weight: 118.162 kg (260 lb 8 oz) 116.8 kg (257 lb 8 oz) 117.209 kg (258 lb 6.4 oz)    Exam:   General: ALERT afebrile not in distress, reports some headache but much improved from yesterday.   Cardiovascular: s1s2  Respiratory: diminished air entry at bases. No wheezing or rhonchi  Abdomen: soft non tender non distended bowel sounds  heard  Musculoskeletal: trace pedal edema.   Data Reviewed: Basic Metabolic Panel:  Recent Labs Lab 10/08/14 1343 10/09/14 0550 10/10/14 1210 10/11/14 0311 10/13/14 0440  NA 138 134*  --  134* 137  K 3.2* 3.5  --  3.7 3.5  CL 101 101  --  102 102  CO2 28 25  --  21 30  GLUCOSE 130* 104*  --  148* 106*  BUN 16 15  --  8 12  CREATININE 1.21* 1.13* 1.24* 0.98 1.16*  CALCIUM 9.4 8.8  --  8.8 8.8  MG  --   --   --   --  2.1   Liver Function Tests:  Recent Labs Lab 10/09/14 0550  AST 23  ALT 17  ALKPHOS 45  BILITOT 0.6  PROT 7.3  ALBUMIN 3.7   No results for input(s): LIPASE, AMYLASE in the last 168 hours. No results for input(s): AMMONIA in the last 168 hours. CBC:  Recent Labs Lab 10/08/14 1343 10/09/14 0550 10/10/14 1210  WBC 6.3 9.6 8.4  HGB 13.3 12.7 11.2*  HCT 40.6 39.0 34.5*  MCV 82.9 82.8 82.5  PLT 317 308 306   Cardiac Enzymes:  Recent Labs Lab 10/08/14 2319 10/09/14 0550 10/09/14 1000 10/09/14 1335 10/09/14 2016  TROPONINI <0.03 0.15* 0.24* 0.28* 0.24*   BNP (last 3 results) No results for input(s): PROBNP in the last 8760 hours. CBG: No results for input(s): GLUCAP in the last 168 hours.  Recent Results (  from the past 240 hour(s))  MRSA PCR Screening     Status: None   Collection Time: 10/08/14  7:55 PM  Result Value Ref Range Status   MRSA by PCR NEGATIVE NEGATIVE Final    Comment:        The GeneXpert MRSA Assay (FDA approved for NASAL specimens only), is one component of a comprehensive MRSA colonization surveillance program. It is not intended to diagnose MRSA infection nor to guide or monitor treatment for MRSA infections.      Studies: No results found.  Scheduled Meds: . amLODipine  10 mg Oral Daily  . aspirin EC  81 mg Oral Daily  . ciprofloxacin-dexamethasone  4 drop Right Ear BID  . docusate sodium  50 mg Oral Daily  . enoxaparin (LOVENOX) injection  40 mg Subcutaneous Q24H  . hydrALAZINE  25 mg Oral 3 times  per day  . hydrochlorothiazide  25 mg Oral Daily  . lisinopril  20 mg Oral Daily  . metoprolol tartrate  50 mg Oral BID  . polyethylene glycol  17 g Oral Daily  . sodium chloride  3 mL Intravenous Q12H  . spironolactone  25 mg Oral Daily   Continuous Infusions: . sodium chloride 1 mL/kg/hr (10/11/14 2312)    Active Problems:   Hypertensive urgency   AKI (acute kidney injury)   Elevated troponin I level   Other chest pain   Pain in the chest    Time spent: 25 MIN.    [facility] Hospital[facility]ULA,Traye Bates  [facility] Pager [phone] If 7PM-7AM, please contact night-coverage at [email or web address], password [email or web address] 10/13/2014, 6:41 PM  LOS: 5 days

## 2014-10-13 NOTE — Care Management Note (Addendum)
    Page 1 of 1   10/15/2014     3:03:55 PM CARE MANAGEMENT NOTE 10/15/2014  Patient:  Theresa Soto,Theresa Soto   Account Number:  [id number]  Date Initiated:  10/13/2014  Documentation initiated by:  GRAVES-BIGELOW,Braylynn Ghan  Subjective/Objective Assessment:   Pt admitted for CP- s/p cath and htn urgency.     Action/Plan:   CM to monitor for disposition needs. FC to contact pt. Will provide informattiion to PCP.   Anticipated DC Date:  10/14/2014   Anticipated DC Plan:  HOME/SELF CARE  In-house referral  Financial Counselor      DC Planning Services  CM consult  [facility]  Follow-up appt scheduled      Choice offered to / List presented to:             Status of service:  Completed, signed off Medicare Important Message given?  NO (If response is "NO", the following Medicare IM given date fields will be blank) Date Medicare IM given:   Medicare IM given by:   Date Additional Medicare IM given:   Additional Medicare IM given by:    Discharge Disposition:  HOME/SELF CARE  Per UR Regulation:  Reviewed for med. necessity/level of care/duration of stay  If discussed at Long Length of Stay Meetings, dates discussed:   10/16/2014    Comments:   10-15-14 1501 Tomi BambergerBrenda Graves-Bigelow, RN,BSN [phone] CM did speak with MD in regards of pt being d/c 10-16-14. CM made Hospital F/u for the [facility]. [facility] [employer] and this information was relayed to RN to see if Security can [facility]. No further needs from CM at this time.   10-14-14 [address] Aaniya Sterba Graves-Bigelow, [place] [phone] CM did call the Artistinancial Counselor. CM did also call the [facility] to schedule an appointment for hospital f/u. Placed on AVS. Pt uses [facility] $4.00 med list.

## 2014-10-14 DIAGNOSIS — I110 Hypertensive heart disease with heart failure: Secondary | ICD-10-CM

## 2014-10-14 DIAGNOSIS — I422 Other hypertrophic cardiomyopathy: Secondary | ICD-10-CM

## 2014-10-14 LAB — MAGNESIUM: Magnesium: 2.2 mg/dL (ref 1.5–2.5)

## 2014-10-14 LAB — BASIC METABOLIC PANEL
Anion gap: 9 (ref 5–15)
BUN: 14 mg/dL (ref 6–23)
CHLORIDE: 97 mmol/L (ref 96–112)
CO2: 29 mmol/L (ref 19–32)
CREATININE: 1.4 mg/dL — AB (ref 0.50–1.10)
Calcium: 9.5 mg/dL (ref 8.4–10.5)
GFR calc non Af Amer: 47 mL/min — ABNORMAL LOW (ref >90)
GFR, EST AFRICAN AMERICAN: 54 mL/min — AB (ref >90)
Glucose, Bld: 102 mg/dL — ABNORMAL HIGH (ref 70–99)
POTASSIUM: 3.8 mmol/L (ref 3.5–5.1)
Sodium: 135 mmol/L (ref 135–145)

## 2014-10-14 MED ORDER — CARVEDILOL 12.5 MG PO TABS
12.5000 mg | ORAL_TABLET | Freq: Two times a day (BID) | ORAL | Status: DC
  Administered 2014-10-14 – 2014-10-16 (×4): 12.5 mg via ORAL
  Filled 2014-10-14 (×4): qty 1

## 2014-10-14 NOTE — Progress Notes (Signed)
Pt admitted for HTN urgency, CP/SOB. She has PCOS and her HTN is probably related to this. Had HA, CT head neg. 2D shows nl LV FXN with severe concentric LVH c/w chronic untreated HTN. She has diuresed and has been placed on mult anti HTN meds. Exam benign. Labs otherwise OK. Renal - Cr trending up.  Active Problems:   Hypertensive urgency   Hypertensive hypertrophic cardiomyopathy   AKI (acute kidney injury)   Elevated troponin I level   Pain in the chest  Subjective: Mostly notes intermittent spells of "having to catch her breath" Hydralazine started yesterday   Objective: Vital signs in last 24 hours: Temp:  [97.5 F (36.4 C)-98.3 F (36.8 C)] 97.5 F (36.4 C) (01/26 0630) Pulse Rate:  [71-76] 73 (01/26 0856) Resp:  [18] 18 (01/26 0630) BP: (148-185)/(96-105) 148/104 mmHg (01/26 0856) SpO2:  [95 %-100 %] 100 % (01/26 0630) Weight:  [255 lb 8 oz (115.894 kg)] 255 lb 8 oz (115.894 kg) (01/26 0630) Last BM Date: 10/07/14  Intake/Output from previous day: 01/25 0701 - 01/26 0700 In: 480 [P.O.:480] Out: 4500 [Urine:4500] Intake/Output this shift: Total I/O In: 240 [P.O.:240] Out: -   Medications Current Facility-Administered Medications  Medication Dose Route Frequency Provider Last Rate Last Dose  . 0.9 %  sodium chloride infusion  1 mL/kg/hr Intravenous Continuous Lyn RecordsHenry W Smith III, MD 118.3 mL/hr at 10/11/14 2312 1 mL/kg/hr at 10/11/14 2312  . acetaminophen (TYLENOL) tablet 650 mg  650 mg Oral Q6H PRN Belkys A Regalado, MD   650 mg at 10/11/14 2046   Or  . acetaminophen (TYLENOL) suppository 650 mg  650 mg Rectal Q6H PRN Belkys A Regalado, MD      . amLODipine (NORVASC) tablet 10 mg  10 mg Oral Daily Lyn RecordsHenry W Smith III, MD   10 mg at 10/14/14 0857  . aspirin EC tablet 81 mg  81 mg Oral Daily Lyn RecordsHenry W Smith III, MD   81 mg at 10/14/14 0857  . ciprofloxacin-dexamethasone (CIPRODEX) 0.3-0.1 % otic suspension 4 drop  4 drop Right Ear BID Alba CoryBelkys A Regalado, MD   4 drop at  10/14/14 0954  . docusate sodium (COLACE) capsule 50 mg  50 mg Oral Daily Haydee SalterYan Liu, MD   50 mg at 10/12/14 0905  . enoxaparin (LOVENOX) injection 40 mg  40 mg Subcutaneous Q24H Lyn RecordsHenry W Smith III, MD   40 mg at 10/14/14 0856  . hydrALAZINE (APRESOLINE) injection 10 mg  10 mg Intravenous Q6H PRN Belkys A Regalado, MD   10 mg at 10/10/14 1752  . hydrALAZINE (APRESOLINE) tablet 25 mg  25 mg Oral 3 times per day Kathlen ModyVijaya Akula, MD   25 mg at 10/14/14 [id number]  . HYDROcodone-acetaminophen (NORCO/VICODIN) 5-325 MG per tablet 1-2 tablet  1-2 tablet Oral Q4H PRN Alba CoryBelkys A Regalado, MD   2 tablet at 10/14/14 [phone]  . lisinopril (PRINIVIL,ZESTRIL) tablet 20 mg  20 mg Oral Daily Kathlen ModyVijaya Akula, MD   20 mg at 10/14/14 0856  . metoprolol (LOPRESSOR) tablet 50 mg  50 mg Oral BID Lyn RecordsHenry W Smith III, MD   50 mg at 10/14/14 0857  . morphine 2 MG/ML injection 1 mg  1 mg Intravenous Q4H PRN Belkys A Regalado, MD   1 mg at 10/11/14 2312  . nitroGLYCERIN (NITROSTAT) SL tablet 0.4 mg  0.4 mg Sublingual Q5 min PRN Belkys A Regalado, MD   0.4 mg at 10/09/14 1204  . ondansetron (ZOFRAN) tablet 4 mg  4 mg Oral  Q6H PRN Belkys A Regalado, MD   4 mg at 10/12/14 0730   Or  . ondansetron (ZOFRAN) injection 4 mg  4 mg Intravenous Q6H PRN Belkys A Regalado, MD   4 mg at 10/10/14 2039  . polyethylene glycol (MIRALAX / GLYCOLAX) packet 17 g  17 g Oral Daily Haydee Salter, MD   17 g at 10/14/14 0856  . sodium chloride 0.9 % injection 3 mL  3 mL Intravenous Q12H Belkys A Regalado, MD   3 mL at 10/14/14 0856  . sodium chloride 0.9 % injection 3 mL  3 mL Intravenous PRN Belkys A Regalado, MD      . spironolactone (ALDACTONE) tablet 25 mg  25 mg Oral Daily Rollene Rotunda, MD   25 mg at 10/14/14 0856    PE: General appearance: alert, cooperative and no distress Lungs: Decreased BS bilaterally but no rales or wheezing Heart: regular rate and rhythm, S1, S2 normal, no murmur, click, rub or gallop Extremities: No LEE Pulses: 2+ and symmetric Skin: Warm  and dry Neurologic: Grossly normal  Lab Results:  No results for input(s): WBC, HGB, HCT, PLT in the last 72 hours. BMET  Recent Labs  10/13/14 0440 10/14/14 0425  NA 137 135  K 3.5 3.8  CL 102 97  CO2 30 29  GLUCOSE 106* 102*  BUN 12 14  CREATININE 1.16* 1.40*  CALCIUM 8.8 9.5   PT/INR No results for input(s): LABPROT, INR in the last 72 hours. Cholesterol No results for input(s): CHOL in the last 72 hours. Lipid Panel     Component Value Date/Time   CHOL 151 10/11/2014 0311   TRIG 116 10/11/2014 0311   HDL 62 10/11/2014 0311   CHOLHDL 2.4 10/11/2014 0311   VLDL 23 10/11/2014 0311   LDLCALC 66 10/11/2014 0311    Assessment/Plan   Hypertensive urgency / Malignant HTN (HTN Hypertrophic Heart, + Tropoin & AKI)- ? Related to PCOS Amlodipine 10, hydralazine 25Q8 (added yesterday), HCTZ 25, lisinopril 20, lopressor 50 BID, spironolactone 25.   Med changes made yesterday.  I would monitor today and make additional adjustments later or in the morning.  K WNL.  Net fluids: -2.8L-7.5L .Marland Kitchen  Can consider converting Metoprolol to Carvedilol for better BP / CHF care  Suspect Hypertensive Heart Disease (CM) -- on spironolactone, would consider converting HCTZ to chlorthalidone (one renal Fx stabilizes)   AKI (acute kidney injury) - may also be related to Malignant HTN ~ ATN  Slight increase in SCr today.  May ne to back off on HCTZ or spironolactone.  Monitor.  Check RA Dopplers (to eval for cause of HTN as well as RF)   Elevated troponin I level  Troponin peaked at 0.28.  SP Left heart cath reveling 40% LAD.  Thought to be demand ischemia.     Pain in the chest  Noncardiac  Will arrange OP f/u on d/c.  Anticipate d/c soon. Will see as need for final d/c recommendations.    LOS: 6 days    Keegen Heffern W 10/14/2014 11:35 AM

## 2014-10-14 NOTE — Progress Notes (Signed)
[facility] PROGRESS NOTE  Theresa LevanMelisa Soto JYN:[id number] DOB: [birth year] DOA: 10/08/2014 PCP: No PCP Per Patient Interim summary: 39 y.o. female with no significant PMHx, presented to ED with severe headache and blurry vision, . She was found to have malignant hypertension. On the night of admission, pt developed severe chest pain, elevated troponins and she underwent cardiac catheterization the next day. She was found to have 40% narrowing of the proximal to mid LAD. Normal LVEF. She was recommended aggressive medical management.   Assessment/Plan: 1. Hypertensive urgency: her bp has plateau ed.  Her headache improved this am. Resume norvasc and lisinopril. We changed metoprolol to coreg and stopped hctz as her creatinine is getting worse. Renal artery stenosis work up ordered. Watch renal function.   Chest pain: Resolved. She underwent left heart cardiac cath with angiography and ventriculography,  showed widely patent coronary arteries. It also showed 40 % narrowing of the proximal to mid LAD. Medical management for now.  Lipid panel showed LDL of 66, and hgba1c is 6.2, she is borderline diabetic . Marland Kitchen. Resume aspirin.   Mild anemia: Continue to monitor.    Mild renal insufficiency: Unclear etiology UA is negative.  Will repeat renal parameters in am.    Elevated troponin related to demand ischemia    DVT prophylaxis.   Code Status: full code.  Family Communication: none at bedside Disposition Plan: pending.    Consultants:  Cardiology   Procedures:  Cardiac cath 1/22  Antibiotics: none HPI/Subjective: Headache is better, nausea has improved.she reports feeling better.  Objective: Filed Vitals:   10/14/14 1630  BP: 159/106  Pulse: 81  Temp:   Resp:     Intake/Output Summary (Last 24 hours) at 10/14/14 1918 Last data filed at 10/14/14 1427  Gross per 24 hour  Intake    240 ml  Output   5900 ml  Net  -5660 ml   Filed Weights   10/12/14 0619 10/13/14  0400 10/14/14 0630  Weight: 116.8 kg (257 lb 8 oz) 117.209 kg (258 lb 6.4 oz) 115.894 kg (255 lb 8 oz)    Exam:   General: ALERT afebrile not in distress, reports some headache but much improved from yesterday.   Cardiovascular: s1s2  Respiratory: diminished air entry at bases. No wheezing or rhonchi  Abdomen: soft non tender non distended bowel sounds heard  Musculoskeletal: trace pedal edema.   Data Reviewed: Basic Metabolic Panel:  Recent Labs Lab 10/08/14 1343 10/09/14 0550 10/10/14 1210 10/11/14 0311 10/13/14 0440 10/14/14 0425  NA 138 134*  --  134* 137 135  K 3.2* 3.5  --  3.7 3.5 3.8  CL 101 101  --  102 102 97  CO2 28 25  --  21 30 29   GLUCOSE 130* 104*  --  148* 106* 102*  BUN 16 15  --  8 12 14   CREATININE 1.21* 1.13* 1.24* 0.98 1.16* 1.40*  CALCIUM 9.4 8.8  --  8.8 8.8 9.5  MG  --   --   --   --  2.1 2.2   Liver Function Tests:  Recent Labs Lab 10/09/14 0550  AST 23  ALT 17  ALKPHOS 45  BILITOT 0.6  PROT 7.3  ALBUMIN 3.7   No results for input(s): LIPASE, AMYLASE in the last 168 hours. No results for input(s): AMMONIA in the last 168 hours. CBC:  Recent Labs Lab 10/08/14 1343 10/09/14 0550 10/10/14 1210  WBC 6.3 9.6 8.4  HGB 13.3 12.7 11.2*  HCT 40.6  39.0 34.5*  MCV 82.9 82.8 82.5  PLT 317 308 306   Cardiac Enzymes:  Recent Labs Lab 10/08/14 2319 10/09/14 0550 10/09/14 1000 10/09/14 1335 10/09/14 2016  TROPONINI <0.03 0.15* 0.24* 0.28* 0.24*   BNP (last 3 results) No results for input(s): PROBNP in the last 8760 hours. CBG: No results for input(s): GLUCAP in the last 168 hours.  Recent Results (from the past 240 hour(s))  MRSA PCR Screening     Status: None   Collection Time: 10/08/14  7:55 PM  Result Value Ref Range Status   MRSA by PCR NEGATIVE NEGATIVE Final    Comment:        The GeneXpert MRSA Assay (FDA approved for NASAL specimens only), is one component of a comprehensive MRSA colonization surveillance  program. It is not intended to diagnose MRSA infection nor to guide or monitor treatment for MRSA infections.      Studies: No results found.  Scheduled Meds: . amLODipine  10 mg Oral Daily  . aspirin EC  81 mg Oral Daily  . carvedilol  12.5 mg Oral BID WC  . ciprofloxacin-dexamethasone  4 drop Right Ear BID  . docusate sodium  50 mg Oral Daily  . enoxaparin (LOVENOX) injection  40 mg Subcutaneous Q24H  . hydrALAZINE  25 mg Oral 3 times per day  . lisinopril  20 mg Oral Daily  . polyethylene glycol  17 g Oral Daily  . sodium chloride  3 mL Intravenous Q12H  . spironolactone  25 mg Oral Daily   Continuous Infusions:    Active Problems:   Hypertensive urgency   AKI (acute kidney injury)   Elevated troponin I level   Pain in the chest   Hypertensive hypertrophic cardiomyopathy    Time spent: 25 MIN.    [facility]  [facility] Pager [phone] If 7PM-7AM, please contact night-coverage at [email or web address], password [facility] 10/14/2014, 7:18 PM  LOS: 6 days

## 2014-10-15 ENCOUNTER — Encounter (HOSPITAL_COMMUNITY): Payer: Self-pay | Admitting: Cardiology

## 2014-10-15 DIAGNOSIS — I10 Essential (primary) hypertension: Secondary | ICD-10-CM

## 2014-10-15 DIAGNOSIS — R519 Headache, unspecified: Secondary | ICD-10-CM | POA: Insufficient documentation

## 2014-10-15 DIAGNOSIS — R072 Precordial pain: Secondary | ICD-10-CM

## 2014-10-15 DIAGNOSIS — R51 Headache: Secondary | ICD-10-CM

## 2014-10-15 LAB — BASIC METABOLIC PANEL
ANION GAP: 9 (ref 5–15)
BUN: 16 mg/dL (ref 6–23)
CALCIUM: 9.3 mg/dL (ref 8.4–10.5)
CO2: 28 mmol/L (ref 19–32)
CREATININE: 1.23 mg/dL — AB (ref 0.50–1.10)
Chloride: 97 mmol/L (ref 96–112)
GFR calc Af Amer: 64 mL/min — ABNORMAL LOW (ref >90)
GFR, EST NON AFRICAN AMERICAN: 55 mL/min — AB (ref >90)
GLUCOSE: 102 mg/dL — AB (ref 70–99)
POTASSIUM: 4.1 mmol/L (ref 3.5–5.1)
Sodium: 134 mmol/L — ABNORMAL LOW (ref 135–145)

## 2014-10-15 MED ORDER — BISACODYL 10 MG RE SUPP
10.0000 mg | Freq: Every day | RECTAL | Status: DC | PRN
  Administered 2014-10-15: 10 mg via RECTAL
  Filled 2014-10-15: qty 1

## 2014-10-15 NOTE — Progress Notes (Signed)
*  PRELIMINARY RESULTS* Vascular Ultrasound Renal Artery Duplex has been completed.  Preliminary findings: No evidence of renal artery stenosis.   Farrel DemarkJill Eunice, RDMS, RVT  10/15/2014, 9:22 AM

## 2014-10-15 NOTE — Progress Notes (Signed)
[facility]                                                                              Patient Demographics  Theresa Soto, is a 39 y.o. female, DOB - 1976/02/23, WJX:[id number]  Admit date - 10/08/2014   Admitting Physician Alba Cory, MD  Outpatient Primary MD for the patient is No PCP Per Patient  LOS - 7   Chief Complaint  Patient presents with  . Chest Pain  . Headache  . Shortness of Breath      HPI on 10/08/2014 by Dr. Erenest Rasher Lewey is a 39 y.o. female with no significant PMH, who presents to ED complainingof headaches. She report history of headaches on and off. Headache got worse 2 days prior to admission. She was not able to tolerates headaches today. Headaches is better since her blood pressure is lower. She denies any prior history of HTN. She denies focal weakness, she does report mild blurry vision for last 2 days that is better now. No dysarthria. She also presents with chest pain, pressure like,sharp. Chest pain has resolved since her BP has decreased.  Evaluation in the ED; She received IV hydralazine and labetalol. Her BP initially was 234/142. CT head : Chronic atrophic and ischemic changes without acute abnormality. Chest x ray negative for cardiopulmonary diseases.   Assessment & Plan   Hypertensive urgency  -Better controlled -She states her headache has resolved -Continue Norvasc, lisinopril, Coreg -HCTZ held due to acute kidney injury -Cardiology consultation appreciated, one stroke rule out renal artery stenosis -Renal artery duplex shows no evidence of stenosis  Acute kidney injury/CKD Stage 3? -Creatinine improved 1.2 (possible baseline)  Chest pain/Elevated troponin -Resolved -Cardilogy consulted and appreciated -patient had left heart cath with angiography and ventriculography which showed widely patent coronary arteries, 40% narrowing of the proximal to mid LAD -Continue medical management -Lipid panel showed LDL  of 66 -Hemoglobin A1c 6.2 -Continue aspirin -Elevated trop likely demand ischemia due to hypertensive urgency  Code Status: Full  Family Communication: None at bedside  Disposition Plan: Admitted  Time Spent in minutes   30 minutes  Procedures  Left heart catheterization with coronary angiophay  Consults   Cardiology  DVT Prophylaxis  Lovenox  Lab Results  Component Value Date   PLT 306 10/10/2014    Medications  Scheduled Meds: . amLODipine  10 mg Oral Daily  . aspirin EC  81 mg Oral Daily  . carvedilol  12.5 mg Oral BID WC  . ciprofloxacin-dexamethasone  4 drop Right Ear BID  . docusate sodium  50 mg Oral Daily  . enoxaparin (LOVENOX) injection  40 mg Subcutaneous Q24H  . hydrALAZINE  25 mg Oral 3 times per day  . lisinopril  20 mg Oral Daily  . polyethylene glycol  17 g Oral Daily  . sodium chloride  3 mL Intravenous Q12H  . spironolactone  25 mg Oral Daily   Continuous Infusions:  PRN Meds:.acetaminophen **OR** acetaminophen, bisacodyl, hydrALAZINE, HYDROcodone-acetaminophen, morphine injection, nitroGLYCERIN, ondansetron **OR** ondansetron (ZOFRAN) IV, sodium chloride  Antibiotics    Anti-infectives    None      Subjective:   Karsten Rash seen and  examined today.  Patient denies any shortness of breath, chest pain, headache at this time. She does worry about her blood pressure not being well controlled.   Objective:   Filed Vitals:   10/14/14 1630 10/14/14 2151 10/15/14 0530 10/15/14 1427  BP: 159/106 146/92 149/94 149/92  Pulse: 81 70 78 86  Temp:  99 F (37.2 C) 98.5 F (36.9 C) 98.7 F (37.1 C)  TempSrc:  Oral Oral Oral  Resp:  18 18 18   Height:      Weight:   115.7 kg (255 lb 1.2 oz)   SpO2:  100% 100% 95%    Wt Readings from Last 3 Encounters:  10/15/14 115.7 kg (255 lb 1.2 oz)  10/08/14 117.935 kg (260 lb)     Intake/Output Summary (Last 24 hours) at 10/15/14 1501 Last data filed at 10/15/14 1300  Gross per 24 hour  Intake     480 ml  Output    450 ml  Net     30 ml    Exam  General: Well developed, well nourished, NAD, appears stated age  HEENT: NCAT, mucous membranes moist.   Cardiovascular: S1 S2 auscultated, no rubs, murmurs or gallops. Regular rate and rhythm.  Respiratory: Clear to auscultation bilaterally with equal chest rise  Abdomen: Soft, obese, nontender, nondistended, + bowel sounds  Extremities: warm dry without cyanosis clubbing or edema  Neuro: AAOx3, nonfocal  Psych: Normal affect and demeanor with intact judgement and insight  Data Review   Micro Results Recent Results (from the past 240 hour(s))  MRSA PCR Screening     Status: None   Collection Time: 10/08/14  7:55 PM  Result Value Ref Range Status   MRSA by PCR NEGATIVE NEGATIVE Final    Comment:        The GeneXpert MRSA Assay (FDA approved for NASAL specimens only), is one component of a comprehensive MRSA colonization surveillance program. It is not intended to diagnose MRSA infection nor to guide or monitor treatment for MRSA infections.     Radiology Reports Dg Chest 2 View  10/08/2014   CLINICAL DATA:  Complains of headache, nausea and chest pressure.  EXAM: CHEST  2 VIEW  COMPARISON:  None.  FINDINGS: Lungs are clear. Heart and mediastinum are within normal limits. The trachea is midline. Negative for pleural effusions. Bone structures are unremarkable.  IMPRESSION: No active cardiopulmonary disease.   Electronically Signed   By: Richarda OverlieAdam  Henn M.D.   On: 10/08/2014 14:58   Ct Head Wo Contrast  10/08/2014   CLINICAL DATA:  Left-sided temporal headache for 2 days, initial encounter  EXAM: CT HEAD WITHOUT CONTRAST  TECHNIQUE: Contiguous axial images were obtained from the base of the skull through the vertex without intravenous contrast.  COMPARISON:  08/10/2013  FINDINGS: Mild atrophic changes and chronic white matter ischemic change are again noted stable from the prior exam. No findings to suggest acute hemorrhage,  acute infarction or space-occupying mass lesion are noted.  IMPRESSION: Chronic atrophic and ischemic changes without acute abnormality.   Electronically Signed   By: Alcide CleverMark  Lukens M.D.   On: 10/08/2014 15:31   Mr Brain Wo Contrast  10/08/2014   CLINICAL DATA:  Progressive headaches with shortness of breath.  EXAM: MRI HEAD WITHOUT CONTRAST  TECHNIQUE: Multiplanar, multiecho pulse sequences of the brain and surrounding structures were obtained without intravenous contrast.  COMPARISON:  CT head without contrast 10/08/2014  FINDINGS: Moderate generalized atrophy is present. Confluent periventricular shin white matter changes are  present bilaterally with other scattered subcortical T2 changes is well. The corpus callosum is intact. No acute infarct, hemorrhage, or mass lesion is present. The ventricles are proportionate to the degree of atrophy. No significant extra-axial fluid collections are present. Mild prominence of the extra-axial CSF is due to atrophy.  Flow is present in the major intracranial arteries. The globes and orbits are intact. The paranasal sinuses are clear. A right mastoid effusion is evident. No obstructing nasopharyngeal lesion is present.  IMPRESSION: 1. Moderate generalized atrophy. This may be related to chronic microvascular ischemic changes. 2. Confluent periventricular and subcortical white matter changes bilaterally. The differential diagnosis include severe microvascular changes, markedly premature for age, versus a demyelinating process. 3. Prominent right mastoid effusion. No obstructing nasopharyngeal lesion is evident.   Electronically Signed   By: Gennette Pac M.D.   On: 10/08/2014 19:32    CBC  Recent Labs Lab 10/09/14 0550 10/10/14 1210  WBC 9.6 8.4  HGB 12.7 11.2*  HCT 39.0 34.5*  PLT 308 306  MCV 82.8 82.5  MCH 27.0 26.8  MCHC 32.6 32.5  RDW 14.6 14.7    Chemistries   Recent Labs Lab 10/09/14 0550 10/10/14 1210 10/11/14 0311 10/13/14 0440  10/14/14 0425 10/15/14 0839  NA 134*  --  134* 137 135 134*  K 3.5  --  3.7 3.5 3.8 4.1  CL 101  --  102 102 97 97  CO2 25  --  GLUCOSE 104*  --  148* 106* 102* 102*  BUN 15  --  CREATININE 1.13* 1.24* 0.98 1.16* 1.40* 1.23*  CALCIUM 8.8  --  8.8 8.8 9.5 9.3  MG  --   --   --  2.1 2.2  --   AST 23  --   --   --   --   --   ALT 17  --   --   --   --   --   ALKPHOS 45  --   --   --   --   --   BILITOT 0.6  --   --   --   --   --    ------------------------------------------------------------------------------------------------------------------ estimated creatinine clearance is 80.2 mL/min (by C-G formula based on Cr of 1.23). ------------------------------------------------------------------------------------------------------------------ No results for input(s): HGBA1C in the last 72 hours. ------------------------------------------------------------------------------------------------------------------ No results for input(s): CHOL, HDL, LDLCALC, TRIG, CHOLHDL, LDLDIRECT in the last 72 hours. ------------------------------------------------------------------------------------------------------------------ No results for input(s): TSH, T4TOTAL, T3FREE, THYROIDAB in the last 72 hours.  Invalid input(s): FREET3 ------------------------------------------------------------------------------------------------------------------ No results for input(s): VITAMINB12, FOLATE, FERRITIN, TIBC, IRON, RETICCTPCT in the last 72 hours.  Coagulation profile  Recent Labs Lab 10/09/14 2308  INR 1.13    No results for input(s): DDIMER in the last 72 hours.  Cardiac Enzymes  Recent Labs Lab 10/09/14 1000 10/09/14 1335 10/09/14 2016  TROPONINI 0.24* 0.28* 0.24*   ------------------------------------------------------------------------------------------------------------------ Invalid input(s): POCBNP    Jaycee Pelzer D.O. on 10/15/2014 at 3:01 PM  Between  7am to 7pm - Pager - [phone]  After 7pm go to [email or web address] - password [email or web address]  And look for the night coverage person covering for me after hours  [facility] Office  [phone]

## 2014-10-15 NOTE — Progress Notes (Addendum)
Pt admitted for HTN urgency, CP/SOB. She has PCOS and her HTN is probably related to this. Had HA, CT head neg. 2D shows nl LV FXN with severe concentric LVH c/w chronic untreated HTN. She has diuresed and has been placed on mult anti HTN meds. Exam benign. Labs otherwise OK. Renal - Cr trending up.  Active Problems:   Hypertensive urgency   Hypertensive hypertrophic cardiomyopathy   AKI (acute kidney injury)   Elevated troponin I level   Pain in the chest  Subjective: Feels a lot better today Stayed o/n to monitor med changes & for Renal duplex  Objective: Vital signs in last 24 hours: Temp:  [98.5 F (36.9 C)-99 F (37.2 C)] 98.5 F (36.9 C) (01/27 0530) Pulse Rate:  [70-81] 78 (01/27 0530) Resp:  [18] 18 (01/27 0530) BP: (146-159)/(92-106) 149/94 mmHg (01/27 0530) SpO2:  [96 %-100 %] 100 % (01/27 0530) Weight:  [255 lb 1.2 oz (115.7 kg)] 255 lb 1.2 oz (115.7 kg) (01/27 0530) Last BM Date: 10/07/14  Intake/Output from previous day: 01/26 0701 - 01/27 0700 In: 480 [P.O.:480] Out: 1850 [Urine:1850] Intake/Output this shift:    Medications Current Facility-Administered Medications  Medication Dose Route Frequency Provider Last Rate Last Dose  . acetaminophen (TYLENOL) tablet 650 mg  650 mg Oral Q6H PRN Belkys A Regalado, MD   650 mg at 10/11/14 2046   Or  . acetaminophen (TYLENOL) suppository 650 mg  650 mg Rectal Q6H PRN Belkys A Regalado, MD      . amLODipine (NORVASC) tablet 10 mg  10 mg Oral Daily Lyn Records III, MD   10 mg at 10/14/14 0857  . aspirin EC tablet 81 mg  81 mg Oral Daily Lyn Records III, MD   81 mg at 10/14/14 0857  . carvedilol (COREG) tablet 12.5 mg  12.5 mg Oral BID WC Kathlen Mody, MD   12.5 mg at 10/14/14 1630  . ciprofloxacin-dexamethasone (CIPRODEX) 0.3-0.1 % otic suspension 4 drop  4 drop Right Ear BID Alba Cory, MD   4 drop at 10/14/14 2158  . docusate sodium (COLACE) capsule 50 mg  50 mg Oral Daily Haydee Salter, MD   50 mg at 10/12/14  0905  . enoxaparin (LOVENOX) injection 40 mg  40 mg Subcutaneous Q24H Lyn Records III, MD   40 mg at 10/14/14 0856  . hydrALAZINE (APRESOLINE) injection 10 mg  10 mg Intravenous Q6H PRN Belkys A Regalado, MD   10 mg at 10/10/14 1752  . hydrALAZINE (APRESOLINE) tablet 25 mg  25 mg Oral 3 times per day Kathlen Mody, MD   25 mg at 10/15/14 9562  . HYDROcodone-acetaminophen (NORCO/VICODIN) 5-325 MG per tablet 1-2 tablet  1-2 tablet Oral Q4H PRN Alba Cory, MD   2 tablet at 10/15/14 0625  . lisinopril (PRINIVIL,ZESTRIL) tablet 20 mg  20 mg Oral Daily Kathlen Mody, MD   20 mg at 10/14/14 0856  . morphine 2 MG/ML injection 1 mg  1 mg Intravenous Q4H PRN Belkys A Regalado, MD   1 mg at 10/11/14 2312  . nitroGLYCERIN (NITROSTAT) SL tablet 0.4 mg  0.4 mg Sublingual Q5 min PRN Belkys A Regalado, MD   0.4 mg at 10/09/14 1204  . ondansetron (ZOFRAN) tablet 4 mg  4 mg Oral Q6H PRN Belkys A Regalado, MD   4 mg at 10/12/14 0730   Or  . ondansetron (ZOFRAN) injection 4 mg  4 mg Intravenous Q6H PRN Alba Cory, MD  4 mg at 10/10/14 2039  . polyethylene glycol (MIRALAX / GLYCOLAX) packet 17 g  17 g Oral Daily Haydee SalterYan Liu, MD   17 g at 10/14/14 0856  . sodium chloride 0.9 % injection 3 mL  3 mL Intravenous Q12H Belkys A Regalado, MD   3 mL at 10/14/14 2200  . sodium chloride 0.9 % injection 3 mL  3 mL Intravenous PRN Belkys A Regalado, MD      . spironolactone (ALDACTONE) tablet 25 mg  25 mg Oral Daily Rollene RotundaJames Hochrein, MD   25 mg at 10/14/14 0856    PE: General appearance: alert, cooperative and no distress Lungs: Decreased BS bilaterally but no rales or wheezing Heart: regular rate and rhythm, S1, S2 normal, no murmur, click, rub or gallop Extremities: No LEE Pulses: 2+ and symmetric Skin: Warm and dry Neurologic: Grossly normal  Lab Results:  No results for input(s): WBC, HGB, HCT, PLT in the last 72 hours. BMET  Recent Labs  10/13/14 0440 10/14/14 0425  NA 137 135  K 3.5 3.8  CL 102 97    CO2 30 29  GLUCOSE 106* 102*  BUN 12 14  CREATININE 1.16* 1.40*  CALCIUM 8.8 9.5   PT/INR No results for input(s): LABPROT, INR in the last 72 hours. Cholesterol No results for input(s): CHOL in the last 72 hours. Lipid Panel     Component Value Date/Time   CHOL 151 10/11/2014 0311   TRIG 116 10/11/2014 0311   HDL 62 10/11/2014 0311   CHOLHDL 2.4 10/11/2014 0311   VLDL 23 10/11/2014 0311   LDLCALC 66 10/11/2014 0311    Assessment/Plan   Hypertensive urgency / Malignant HTN (HTN Hypertrophic Heart, + Tropoin & AKI)- ? Related to PCOS -- BP looks much improved & Sx have improved. Amlodipine 10, hydralazine 25Q8 (added yesterday), HCTZ 25, lisinopril 20, lopressor 50 BID, spironolactone 25.   Med changes made yesterday.  I would monitor today and make additional adjustments later or in the morning.  K WNL.  Net fluids: -~13 L..  Converted to Carvedilol yesterday for better BP / CHF care  Suspect Hypertensive Heart Disease (CM) -- on spironolactone, would consider converting HCTZ to chlorthalidone (one renal Fx stabilizes); currently on hold for rising Cr.  Auto-diuresed with BP control / afterload reduction   AKI (acute kidney injury) - may also be related to Malignant HTN ~ ATN; Labs pending for this AM  Slight increase in SCr today. HCTZ held.  Continued spironolactone.  Monitor AML.  Check RA Dopplers (to eval for cause of HTN as well as RF) - is NPO.   Elevated troponin I level  Troponin peaked at 0.28.  SP Left heart cath reveling 40% LAD.  Thought to be demand ischemia.     Pain in the chest  Noncardiac  Will arrange OP f/u on d/c - with Dr. Clifton JamesMcAlhany / APP .  Anticipate d/c today pending results of renal Dopplers    LOS: 7 days    Zechariah Bissonnette W 10/15/2014 8:20 AM  Addendum: Patient [facility] On 2/8.

## 2014-10-16 LAB — BASIC METABOLIC PANEL
Anion gap: 9 (ref 5–15)
BUN: 17 mg/dL (ref 6–23)
CHLORIDE: 101 mmol/L (ref 96–112)
CO2: 25 mmol/L (ref 19–32)
Calcium: 9.4 mg/dL (ref 8.4–10.5)
Creatinine, Ser: 1.27 mg/dL — ABNORMAL HIGH (ref 0.50–1.10)
GFR, EST AFRICAN AMERICAN: 61 mL/min — AB (ref >90)
GFR, EST NON AFRICAN AMERICAN: 53 mL/min — AB (ref >90)
Glucose, Bld: 102 mg/dL — ABNORMAL HIGH (ref 70–99)
Potassium: 4.4 mmol/L (ref 3.5–5.1)
SODIUM: 135 mmol/L (ref 135–145)

## 2014-10-16 MED ORDER — CIPROFLOXACIN-DEXAMETHASONE 0.3-0.1 % OT SUSP: 4.0000 [drp] | Freq: Two times a day (BID) | OTIC | Status: DC

## 2014-10-16 MED ORDER — LISINOPRIL 20 MG PO TABS: 20.0000 mg | ORAL_TABLET | Freq: Every day | ORAL | Status: DC

## 2014-10-16 MED ORDER — HYDRALAZINE HCL 25 MG PO TABS: 25.0000 mg | ORAL_TABLET | Freq: Three times a day (TID) | ORAL | Status: DC

## 2014-10-16 MED ORDER — CARVEDILOL 12.5 MG PO TABS: 12.5000 mg | ORAL_TABLET | Freq: Two times a day (BID) | ORAL | Status: DC

## 2014-10-16 MED ORDER — AMLODIPINE BESYLATE 10 MG PO TABS: 10.0000 mg | ORAL_TABLET | Freq: Every day | ORAL | Status: DC

## 2014-10-16 MED ORDER — ASPIRIN 81 MG PO TBEC: 81.0000 mg | DELAYED_RELEASE_TABLET | Freq: Every day | ORAL | Status: DC

## 2014-10-16 MED ORDER — SPIRONOLACTONE 25 MG PO TABS: 25.0000 mg | ORAL_TABLET | Freq: Every day | ORAL | Status: DC

## 2014-10-16 NOTE — Discharge Summary (Signed)
Physician Discharge Summary  Bonnita LevanMelisa Soto EAV:[id number] DOB: 01/19/1976 DOA: 10/08/2014  PCP: No PCP Per Patient  Admit date: 10/08/2014 Discharge date: 10/16/2014  Time spent: 45 minutes  Recommendations for Outpatient Follow-up:  Patient will be discharged home. She [facility] and Dr. Sanjuana KavaMcAlhaney at the scheduled appointments. Patient should resume her medications as prescribed. Patient should follow a heart healthy diet. Patient may resume activity as tolerated.  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 Erenest RasherBelkys Regalado Theresa Soto is a 39 y.o. female with no significant PMH, who presents to ED complainingof headaches. She report history of headaches on and off. Headache got worse 2 days prior to admission. She was not able to tolerates headaches today. Headaches is better since her blood pressure is lower. She denies any prior history of HTN. She denies focal weakness, she does report mild blurry vision for last 2 days that is better now. No dysarthria. She also presents with chest pain, pressure like,sharp. Chest pain has resolved since her BP has decreased. Evaluation in the ED; She received IV hydralazine and labetalol. Her BP initially was 234/142. CT head : Chronic atrophic and ischemic changes without acute abnormality. Chest x ray negative for cardiopulmonary diseases.   Hospital Course:  Hypertensive urgency  -Better controlled -She states her headache has resolved -Continue Norvasc, lisinopril, Coreg -HCTZ held due to acute kidney injury -Cardiology consultation appreciated,  one stroke rule out renal artery stenosis -Renal artery duplex shows no evidence of stenosis  Acute kidney injury/CKD Stage 3? -Creatinine improved 1.2 (possible baseline)  Chest pain/Elevated troponin -Resolved -Cardilogy consulted and appreciated -patient had left heart cath with angiography and ventriculography which showed widely patent coronary arteries, 40% narrowing of the proximal to mid LAD -Continue medical management -Lipid panel showed LDL of 66 -Hemoglobin A1c 6.2 -Continue aspirin -Elevated trop likely demand ischemia due to hypertensive urgency  Obesity, BMI greater than 40 -Spoke with patient regarding lifestyle medications including diet and exercise. Patient will need to follow up with the primary care physician for weight loss plan.  Procedures: Left heart catheterization with coronary angiophay  Consultations: Cardiology  Discharge Exam: Filed Vitals:   10/16/14 0628  BP: 155/98  Pulse:   Temp:   Resp:      General: Well developed, well nourished, NAD, appears stated age  HEENT: NCAT, mucous membranes moist.  Cardiovascular: S1 S2 auscultated, no rubs, murmurs or gallops. Regular rate and rhythm.  Respiratory: Clear to auscultation bilaterally with equal chest rise  Abdomen: Soft, obese, nontender, nondistended, + bowel sounds  Extremities: warm dry without cyanosis clubbing or edema  Neuro: AAOx3, nonfocal  Psych: Appropriate mood and affect  Discharge Instructions      Discharge Instructions    Discharge instructions    Complete by:  As directed   Patient will be discharged home. She [facility] at the scheduled appointment. Patient should resume her medications as prescribed. Patient should follow a heart healthy diet. Patient may resume activity as tolerated.            Medication  List    STOP taking these medications        aspirin-acetaminophen-caffeine 250-250-65 MG per tablet    Commonly known as:  EXCEDRIN MIGRAINE      TAKE these medications        amLODipine 10 MG tablet  Commonly known as:  NORVASC  Take 1 tablet (10 mg total) by mouth daily.     aspirin 81 MG EC tablet  Take 1 tablet (81 mg total) by mouth daily.     carvedilol 12.5 MG tablet  Commonly known as:  COREG  Take 1 tablet (12.5 mg total) by mouth 2 (two) times daily with a meal.     ciprofloxacin-dexamethasone otic suspension  Commonly known as:  CIPRODEX  Place 4 drops into the right ear 2 (two) times daily.     hydrALAZINE 25 MG tablet  Commonly known as:  APRESOLINE  Take 1 tablet (25 mg total) by mouth every 8 (eight) hours.     lisinopril 20 MG tablet  Commonly known as:  PRINIVIL,ZESTRIL  Take 1 tablet (20 mg total) by mouth daily.     spironolactone 25 MG tablet  Commonly known as:  ALDACTONE  Take 1 tablet (25 mg total) by mouth daily.       Allergies  Allergen Reactions  . Codeine     Hives  . Flexeril [Cyclobenzaprine] Hives   Follow-up Information    Follow up with Verne Carrow, MD On 10/27/2014.   Specialty:  Cardiology   Why:  at 0830 with [facility],    Contact information:   [address] [place] [address] [phone]       Follow up with [facility]     On 10/21/2014.   Why:  Hospital Follow up @ 3:00pm   Contact information:   [address] [place] [address] [phone]       The results of significant diagnostics from this hospitalization (including imaging, microbiology, ancillary and laboratory) are listed below for reference.    Significant Diagnostic Studies: Dg Chest 2 View  10/08/2014   CLINICAL DATA:  Complains of headache, nausea and chest pressure.  EXAM: CHEST  2 VIEW  COMPARISON:  None.  FINDINGS: Lungs are clear. Heart and mediastinum are within normal limits. The trachea is midline. Negative for pleural effusions. Bone structures are unremarkable.   IMPRESSION: No active cardiopulmonary disease.   Electronically Signed   By: Richarda Overlie M.D.   On: 10/08/2014 14:58   Ct Head Wo Contrast  10/08/2014   CLINICAL DATA:  Left-sided temporal headache for 2 days, initial encounter  EXAM: CT HEAD WITHOUT CONTRAST  TECHNIQUE: Contiguous axial images were obtained from the base of the skull through the vertex without intravenous contrast.  COMPARISON:  08/10/2013  FINDINGS: Mild atrophic changes and chronic white matter ischemic change are again noted stable from the prior exam. No findings to suggest acute hemorrhage, acute infarction or space-occupying mass lesion are noted.  IMPRESSION: Chronic atrophic and ischemic changes without acute abnormality.   Electronically Signed   By: Alcide Clever M.D.   On: 10/08/2014 15:31   Mr Brain Wo Contrast  10/08/2014   CLINICAL DATA:  Progressive headaches with shortness of breath.  EXAM: MRI HEAD WITHOUT CONTRAST  TECHNIQUE: Multiplanar, multiecho pulse sequences of the brain and surrounding structures were obtained without intravenous contrast.  COMPARISON:  CT head without contrast 10/08/2014  FINDINGS: Moderate generalized atrophy is present. Confluent  periventricular shin white matter changes are present bilaterally with other scattered subcortical T2 changes is well. The corpus callosum is intact. No acute infarct, hemorrhage, or mass lesion is present. The ventricles are proportionate to the degree of atrophy. No significant extra-axial fluid collections are present. Mild prominence of the extra-axial CSF is due to atrophy.  Flow is present in the major intracranial arteries. The globes and orbits are intact. The paranasal sinuses are clear. A right mastoid effusion is evident. No obstructing nasopharyngeal lesion is present.  IMPRESSION: 1. Moderate generalized atrophy. This may be related to chronic microvascular ischemic changes. 2. Confluent periventricular and subcortical white matter changes bilaterally. The  differential diagnosis include severe microvascular changes, markedly premature for age, versus a demyelinating process. 3. Prominent right mastoid effusion. No obstructing nasopharyngeal lesion is evident.   Electronically Signed   By: Gennette Pac M.D.   On: 10/08/2014 19:32    Microbiology: Recent Results (from the past 240 hour(s))  MRSA PCR Screening     Status: None   Collection Time: 10/08/14  7:55 PM  Result Value Ref Range Status   MRSA by PCR NEGATIVE NEGATIVE Final    Comment:        The GeneXpert MRSA Assay (FDA approved for NASAL specimens only), is one component of a comprehensive MRSA colonization surveillance program. It is not intended to diagnose MRSA infection nor to guide or monitor treatment for MRSA infections.      Labs: Basic Metabolic Panel:  Recent Labs Lab 10/11/14 0311 10/13/14 0440 10/14/14 0425 10/15/14 0839 10/16/14 0555  NA 134* 137 135 134* 135  K 3.7 3.5 3.8 4.1 4.4  CL 102 102 97 97 101  CO2 GLUCOSE 148* 106* 102* 102* 102*  BUN CREATININE 0.98 1.16* 1.40* 1.23* 1.27*  CALCIUM 8.8 8.8 9.5 9.3 9.4  MG  --  2.1 2.2  --   --    Liver Function Tests: No results for input(s): AST, ALT, ALKPHOS, BILITOT, PROT, ALBUMIN in the last 168 hours. No results for input(s): LIPASE, AMYLASE in the last 168 hours. No results for input(s): AMMONIA in the last 168 hours. CBC:  Recent Labs Lab 10/10/14 1210  WBC 8.4  HGB 11.2*  HCT 34.5*  MCV 82.5  PLT 306   Cardiac Enzymes:  Recent Labs Lab 10/09/14 1335 10/09/14 2016  TROPONINI 0.28* 0.24*   BNP: BNP (last 3 results) No results for input(s): PROBNP in the last 8760 hours. CBG: No results for input(s): GLUCAP in the last 168 hours.     SignedEdsel Petrin  [facility] 10/16/2014, 10:35 AM

## 2014-10-16 NOTE — Discharge Instructions (Signed)

## 2014-10-21 ENCOUNTER — Inpatient Hospital Stay: Payer: Self-pay | Admitting: Family Medicine

## 2014-10-21 ENCOUNTER — Encounter: Payer: Self-pay | Admitting: Internal Medicine

## 2014-10-27 ENCOUNTER — Encounter: Payer: Self-pay | Admitting: Physician Assistant

## 2014-10-27 DIAGNOSIS — I2510 Atherosclerotic heart disease of native coronary artery without angina pectoris: Secondary | ICD-10-CM | POA: Insufficient documentation

## 2014-10-27 DIAGNOSIS — I119 Hypertensive heart disease without heart failure: Secondary | ICD-10-CM | POA: Insufficient documentation

## 2014-10-27 DIAGNOSIS — I5032 Chronic diastolic (congestive) heart failure: Secondary | ICD-10-CM | POA: Insufficient documentation

## 2014-10-27 NOTE — Progress Notes (Deleted)
Cardiology Office Note   Date:  10/27/2014   ID:  Theresa Soto, DOB [birth year], MRN [id number]  PCP:  No PCP Per Patient  Cardiologist:  Dr. Verne Carrow   Electrophysiologist:  ***  No chief complaint on file.    History of Present Illness: Theresa Soto is a 39 y.o. female with a hx of HTN.  She was admitted *** with HTN urgency (BP 240/140).  She had chest pain with elevated Troponins.  EF was normal by echo with severe LVH.  LHC was done and demonstrated mild non-obstructive stenosis in the LAD and evidence of volume overload (diastolic HF).  Elevated Troponin was thought to be due to demand ischemia.  She was placed on a combination of HCTZ and Spironolactone as well as Amlodipine, Hydralazine, Carvedilol, Lisinopril.  HCTZ was held due to rising creatinine.  Renal artery doppler US was neg for RAS.     Studies/Reports Reviewed Today:  Echocardiogram 10/09/14 - Left ventricle: The cavity size was normal. Severe LVH. EF 55% to 60%. - Left atrium: The atrium was severely dilated.  Cardiac Catheterization 10/10/14 Prox to midLAD 40%.  Spade shaped left ventricular cavity. EF 55%  Renal Artery [place] 10/15/14 - No evidence of renal artery stenosis noted bilaterally. - Bilateral normal intrarenal resistive indices.    Past Medical History  Diagnosis Date  . Hypertensive heart disease     a.  Echo 1/16:  severe LVH, EF 55-60%, severe LAE;  b.  Renal Art [place] 1/16:  no RAS  . CAD (coronary artery disease)     elevated TnI 1/16 in setting of HTN urgency (demand ischemia) >> LHC 1/16:  prox to mid LAD 40%, EF 55%  . Chronic diastolic CHF (congestive heart failure)     Past Surgical History  Procedure Laterality Date  . None    . Left heart catheterization with coronary angiogram N/A 10/10/2014    Procedure: LEFT HEART CATHETERIZATION WITH CORONARY ANGIOGRAM;  Surgeon: Lesleigh Noe, MD;  Location[facility] CATH LAB;  Service: Cardiovascular;  Laterality: N/A;     Current  Outpatient Prescriptions  Medication Sig Dispense Refill  . amLODipine (NORVASC) 10 MG tablet Take 1 tablet (10 mg total) by mouth daily. 30 tablet 0  . aspirin EC 81 MG EC tablet Take 1 tablet (81 mg total) by mouth daily. 30 tablet 0  . carvedilol (COREG) 12.5 MG tablet Take 1 tablet (12.5 mg total) by mouth 2 (two) times daily with a meal. 60 tablet 0  . ciprofloxacin-dexamethasone (CIPRODEX) otic suspension Place 4 drops into the right ear 2 (two) times daily. 7.5 mL 0  . hydrALAZINE (APRESOLINE) 25 MG tablet Take 1 tablet (25 mg total) by mouth every 8 (eight) hours. 90 tablet 0  . lisinopril (PRINIVIL,ZESTRIL) 20 MG tablet Take 1 tablet (20 mg total) by mouth daily. 30 tablet 0  . spironolactone (ALDACTONE) 25 MG tablet Take 1 tablet (25 mg total) by mouth daily. 30 tablet 0   No current facility-administered medications for this visit.    Allergies:   Codeine and Flexeril    Social History:  The patient  reports that she has never smoked. She does not have any smokeless tobacco history on file. She reports that she drinks alcohol. She reports that she does not use illicit drugs.   Family History:  The patient's ***family history includes Heart attack in her father; Hypertension in her father; Kidney disease in her father.    ROS:  Please see  the history of present illness.   Otherwise, review of systems are positive for {NONE DEFAULTED:18576}.   All other systems are reviewed and negative.    PHYSICAL EXAM: VS:  LMP 09/09/2014    Wt Readings from Last 3 Encounters:  10/16/14 255 lb 4.8 oz (115.803 kg)  10/08/14 260 lb (117.935 kg)     GEN: Well nourished, well developed, in no acute distress HEENT: normal Neck: *** JVD, ***carotid bruits, no masses Cardiac:  Normal S1/S2, ***RRR; *** murmur ***, *** no rubs or gallops, {NUMBERS; 1+ TO 4+, TRACE/RARE:14493} edema  Respiratory:  ***clear to auscultation bilaterally, no wheezing, rhonchi or rales. GI: ***soft, nontender,  nondistended, + BS MS: no deformity or atrophy Skin: warm and dry  Neuro:  CNs II-XII intact, Strength and sensation are intact Psych: Normal affect   EKG:  EKG {ACTION; IS/IS [id number]}OT:[id number]} ordered today.  It demonstrates:   ***   Recent Labs: 10/08/2014: B Natriuretic Peptide 22.1 10/09/2014: ALT 17 10/10/2014: Hemoglobin 11.2*; Platelets 306 10/14/2014: Magnesium 2.2 10/16/2014: BUN 17; Creatinine 1.27*; Potassium 4.4; Sodium 135    Recent Labs  10/11/14 0311 10/13/14 0440 10/14/14 0425 10/15/14 0839 10/16/14 0555  K 3.7 3.5 3.8 4.1 4.4  BUN 8 12 14 16 17   CREATININE 0.98 1.16* 1.40* 1.23* 1.27*     Lipid Panel    Component Value Date/Time   CHOL 151 10/11/2014 0311   TRIG 116 10/11/2014 0311   HDL 62 10/11/2014 0311   CHOLHDL 2.4 10/11/2014 0311   VLDL 23 10/11/2014 0311   LDLCALC 66 10/11/2014 0311      ASSESSMENT AND PLAN:  1.  ***Hypertensive heart disease with heart failure:  *** 2.  Chronic diastolic CHF (congestive heart failure):  *** 3.  Coronary artery disease:  *** 4.  Glucose Intolerance:  FU with PCP.   5.  Renal Insufficiency:  Check BMET today.    Current medicines are reviewed at length with the patient today.  The patient {ACTIONS; HAS/DOES NOT HAVE:19233} concerns regarding medicines.  The following changes have been made:  {PLAN; NO CHANGE:13088:s}  Labs/ tests ordered today include: *** No orders of the defined types were placed in this encounter.     Disposition:   FU with *** in {gen number 2-84:132440}0-10:310397} {TIME; UNITS DAY/WEEK/MONTH:19136}   Signed, Brynda RimScott Prapti Grussing, PA-C, MHS 10/27/2014 8:23 AM    [facility] [address], [place], [place]  [address] Phone: [phone]; Fax: [phone]

## 2014-10-27 NOTE — Progress Notes (Signed)
This encounter was created in error - please disregard.

## 2014-11-24 ENCOUNTER — Encounter: Payer: Self-pay | Admitting: Physician Assistant

## 2015-06-22 ENCOUNTER — Encounter (HOSPITAL_COMMUNITY): Payer: Self-pay | Admitting: Emergency Medicine

## 2015-06-22 ENCOUNTER — Emergency Department (HOSPITAL_COMMUNITY)
Admission: EM | Admit: 2015-06-22 | Discharge: 2015-06-22 | Disposition: A | Discharge status: Home / Self Care | Payer: Self-pay | Attending: Emergency Medicine | Admitting: Emergency Medicine

## 2015-06-22 DIAGNOSIS — Z9889 Other specified postprocedural states: Secondary | ICD-10-CM | POA: Insufficient documentation

## 2015-06-22 DIAGNOSIS — E669 Obesity, unspecified: Secondary | ICD-10-CM | POA: Insufficient documentation

## 2015-06-22 DIAGNOSIS — Z7982 Long term (current) use of aspirin: Secondary | ICD-10-CM | POA: Insufficient documentation

## 2015-06-22 DIAGNOSIS — Z79899 Other long term (current) drug therapy: Secondary | ICD-10-CM | POA: Insufficient documentation

## 2015-06-22 DIAGNOSIS — I119 Hypertensive heart disease without heart failure: Secondary | ICD-10-CM | POA: Insufficient documentation

## 2015-06-22 DIAGNOSIS — I10 Essential (primary) hypertension: Secondary | ICD-10-CM | POA: Insufficient documentation

## 2015-06-22 DIAGNOSIS — I2510 Atherosclerotic heart disease of native coronary artery without angina pectoris: Secondary | ICD-10-CM | POA: Insufficient documentation

## 2015-06-22 DIAGNOSIS — H9201 Otalgia, right ear: Secondary | ICD-10-CM | POA: Insufficient documentation

## 2015-06-22 DIAGNOSIS — I5032 Chronic diastolic (congestive) heart failure: Secondary | ICD-10-CM | POA: Insufficient documentation

## 2015-06-22 MED ORDER — AMLODIPINE BESYLATE 5 MG PO TABS
5.0000 mg | ORAL_TABLET | Freq: Once | ORAL | Status: AC
  Administered 2015-06-22: 5 mg via ORAL
  Filled 2015-06-22 (×2): qty 1

## 2015-06-22 MED ORDER — HYDROCHLOROTHIAZIDE 12.5 MG PO TABS: 12.5000 mg | ORAL_TABLET | Freq: Every day | ORAL | Status: DC

## 2015-06-22 MED ORDER — AMLODIPINE BESYLATE 10 MG PO TABS: 10.0000 mg | ORAL_TABLET | Freq: Every day | ORAL | Status: DC

## 2015-06-22 NOTE — Progress Notes (Signed)
CM spoke with pt who confirms uninsured [place] resident with no pcp.  CM discussed and provided written information for uninsured accepting pcps, discussed the importance of pcp vs EDP services for f/u care, www.needymeds.org, www.goodrx.com, discounted pharmacies and other [place] resources such as [facility] , [facility], affordable care act, financial assistance, uninsured dental services, [place] med assist, DSS and  health department  Reviewed resources for [place] uninsured accepting pcps like [facility], [facility] [address], [facility], [facility], local urgent care centers, [facility], [facility], general medical clinics, [facility], [facility] plus others, medication resources, [facility] out [facility]ies and housing Pt voiced understanding and appreciation of resources provided   Provided [facility] contact information Pt agreed to a referral Cm completed referral Pt to be contact by [facility] clinical liason  

## 2015-06-22 NOTE — Discharge Instructions (Signed)
It is important to follow-up with the community health and wellness clinic in order to establish primary care. Please take your blood pressure medications as prescribed. Return to ED for worsening symptoms.  DASH Eating Plan DASH stands for "Dietary Approaches to Stop Hypertension." The DASH eating plan is a healthy eating plan that has been shown to reduce high blood pressure (hypertension). Additional health benefits may include reducing the risk of type 2 diabetes mellitus, heart disease, and stroke. The DASH eating plan may also help with weight loss. WHAT DO I NEED TO KNOW ABOUT THE DASH EATING PLAN? For the DASH eating plan, you will follow these general guidelines:  Choose foods with a percent daily value for sodium of less than 5% (as listed on the food label).  Use salt-free seasonings or herbs instead of table salt or sea salt.  Check with your health care provider or pharmacist before using salt substitutes.  Eat lower-sodium products, often labeled as "lower sodium" or "no salt added."  Eat fresh foods.  Eat more vegetables, fruits, and low-fat dairy products.  Choose whole grains. Look for the word "whole" as the first word in the ingredient list.  Choose fish and skinless chicken or [place] more often than red meat. Limit fish, poultry, and meat to 6 oz (170 g) each day.  Limit sweets, desserts, sugars, and sugary drinks.  Choose heart-healthy fats.  Limit cheese to 1 oz (28 g) per day.  Eat more home-cooked food and less restaurant, buffet, and fast food.  Limit fried foods.  Cook foods using methods other than frying.  Limit canned vegetables. If you do use them, rinse them well to decrease the sodium.  When eating at a restaurant, ask that your food be prepared with less salt, or no salt if possible. WHAT FOODS CAN I EAT? Seek help from a dietitian for individual calorie needs. Grains Whole grain or whole wheat bread. Brown rice. Whole grain or whole wheat  pasta. Quinoa, bulgur, and whole grain cereals. Low-sodium cereals. Corn or whole wheat flour tortillas. Whole grain cornbread. Whole grain crackers. Low-sodium crackers. Vegetables Fresh or frozen vegetables (raw, steamed, roasted, or grilled). Low-sodium or reduced-sodium tomato and vegetable juices. Low-sodium or reduced-sodium tomato sauce and paste. Low-sodium or reduced-sodium canned vegetables.  Fruits All fresh, canned (in natural juice), or frozen fruits. Meat and Other Protein Products Ground beef (85% or leaner), grass-fed beef, or beef trimmed of fat. Skinless chicken or [place]. Ground chicken or [place]. Pork trimmed of fat. All fish and seafood. Eggs. Dried beans, peas, or lentils. Unsalted nuts and seeds. Unsalted canned beans. Dairy Low-fat dairy products, such as skim or 1% milk, 2% or reduced-fat cheeses, low-fat ricotta or cottage cheese, or plain low-fat yogurt. Low-sodium or reduced-sodium cheeses. Fats and Oils Tub margarines without trans fats. Light or reduced-fat mayonnaise and salad dressings (reduced sodium). Avocado. Safflower, olive, or canola oils. Natural peanut or almond butter. Other Unsalted popcorn and pretzels. The items listed above may not be a complete list of recommended foods or beverages. Contact your dietitian for more options. WHAT FOODS ARE NOT RECOMMENDED? Grains White bread. White pasta. White rice. Refined cornbread. Bagels and croissants. Crackers that contain trans fat. Vegetables Creamed or fried vegetables. Vegetables in a cheese sauce. Regular canned vegetables. Regular canned tomato sauce and paste. Regular tomato and vegetable juices. Fruits Dried fruits. Canned fruit in light or heavy syrup. Fruit juice. Meat and Other Protein Products Fatty cuts of meat. Ribs, chicken wings, bacon, sausage, bologna,  salami, chitterlings, fatback, hot dogs, bratwurst, and packaged luncheon meats. Salted nuts and seeds. Canned beans with salt. Dairy Whole  or 2% milk, cream, half-and-half, and cream cheese. Whole-fat or sweetened yogurt. Full-fat cheeses or blue cheese. Nondairy creamers and whipped toppings. Processed cheese, cheese spreads, or cheese curds. Condiments Onion and garlic salt, seasoned salt, table salt, and sea salt. Canned and packaged gravies. Worcestershire sauce. Tartar sauce. Barbecue sauce. Teriyaki sauce. Soy sauce, including reduced sodium. Steak sauce. Fish sauce. Oyster sauce. Cocktail sauce. Horseradish. Ketchup and mustard. Meat flavorings and tenderizers. Bouillon cubes. Hot sauce. Tabasco sauce. Marinades. Taco seasonings. Relishes. Fats and Oils Butter, stick margarine, lard, shortening, ghee, and bacon fat. Coconut, palm kernel, or palm oils. Regular salad dressings. Other Pickles and olives. Salted popcorn and pretzels. The items listed above may not be a complete list of foods and beverages to avoid. Contact your dietitian for more information. WHERE CAN I FIND MORE INFORMATION? National Heart, Lung, and Blood Institute: [employer] Document Released: 08/25/2011 Document Revised: 01/20/2014 Document Reviewed: 07/10/2013 [facility] Patient Information 2015 [place], [place]. This information is not intended to replace advice given to you by your health care provider. Make sure you discuss any questions you have with your health care provider.  Hypertension Hypertension, commonly called high blood pressure, is when the force of blood pumping through your arteries is too strong. Your arteries are the blood vessels that carry blood from your heart throughout your body. A blood pressure reading consists of a higher number over a lower number, such as 110/72. The higher number (systolic) is the pressure inside your arteries when your heart pumps. The lower number (diastolic) is the pressure inside your arteries when your heart relaxes. Ideally you want your blood pressure below  120/80. Hypertension forces your heart to work harder to pump blood. Your arteries may become narrow or stiff. Having hypertension puts you at risk for heart disease, stroke, and other problems.  RISK FACTORS Some risk factors for high blood pressure are controllable. Others are not.  Risk factors you cannot control include:   Race. You may be at higher risk if you are African American.  Age. Risk increases with age.  Gender. Men are at higher risk than women before age 104 years. After age 82, women are at higher risk than men. Risk factors you can control include:  Not getting enough exercise or physical activity.  Being overweight.  Getting too much fat, sugar, calories, or salt in your diet.  Drinking too much alcohol. SIGNS AND SYMPTOMS Hypertension does not usually cause signs or symptoms. Extremely high blood pressure (hypertensive crisis) may cause headache, anxiety, shortness of breath, and nosebleed. DIAGNOSIS  To check if you have hypertension, your health care provider will measure your blood pressure while you are seated, with your arm held at the level of your heart. It should be measured at least twice using the same arm. Certain conditions can cause a difference in blood pressure between your right and left arms. A blood pressure reading that is higher than normal on one occasion does not mean that you need treatment. If one blood pressure reading is high, ask your health care provider about having it checked again. TREATMENT  Treating high blood pressure includes making lifestyle changes and possibly taking medicine. Living a healthy lifestyle can help lower high blood pressure. You may need to change some of your habits. Lifestyle changes may include:  Following the DASH diet. This diet is high in fruits, vegetables, and  whole grains. It is low in salt, red meat, and added sugars.  Getting at least 2 hours of brisk physical activity every week.  Losing weight if  necessary.  Not smoking.  Limiting alcoholic beverages.  Learning ways to reduce stress. If lifestyle changes are not enough to get your blood pressure under control, your health care provider may prescribe medicine. You may need to take more than one. Work closely with your health care provider to understand the risks and benefits. HOME CARE INSTRUCTIONS  Have your blood pressure rechecked as directed by your health care provider.   Take medicines only as directed by your health care provider. Follow the directions carefully. Blood pressure medicines must be taken as prescribed. The medicine does not work as well when you skip doses. Skipping doses also puts you at risk for problems.   Do not smoke.   Monitor your blood pressure at home as directed by your health care provider. SEEK MEDICAL CARE IF:   You think you are having a reaction to medicines taken.  You have recurrent headaches or feel dizzy.  You have swelling in your ankles.  You have trouble with your vision. SEEK IMMEDIATE MEDICAL CARE IF:  You develop a severe headache or confusion.  You have unusual weakness, numbness, or feel faint.  You have severe chest or abdominal pain.  You vomit repeatedly.  You have trouble breathing. MAKE SURE YOU:   Understand these instructions.  Will watch your condition.  Will get help right away if you are not doing well or get worse. Document Released: 09/05/2005 Document Revised: 01/20/2014 Document Reviewed: 06/28/2013 [facility] Patient Information 2015 [place], [place]. This information is not intended to replace advice given to you by your health care provider. Make sure you discuss any questions you have with your health care provider.  Managing Your High Blood Pressure Blood pressure is a measurement of how forceful your blood is pressing against the walls of the arteries. Arteries are muscular tubes within the circulatory system. Blood pressure does not stay the  same. Blood pressure rises when you are active, excited, or nervous; and it lowers during sleep and relaxation. If the numbers measuring your blood pressure stay above normal most of the time, you are at risk for health problems. High blood pressure (hypertension) is a long-term (chronic) condition in which blood pressure is elevated. A blood pressure reading is recorded as two numbers, such as 120 over 80 (or 120/80). The first, higher number is called the systolic pressure. It is a measure of the pressure in your arteries as the heart beats. The second, lower number is called the diastolic pressure. It is a measure of the pressure in your arteries as the heart relaxes between beats.  Keeping your blood pressure in a normal range is important to your overall health and prevention of health problems, such as heart disease and stroke. When your blood pressure is uncontrolled, your heart has to work harder than normal. High blood pressure is a very common condition in adults because blood pressure tends to rise with age. Men and women are equally likely to have hypertension but at different times in life. Before age 91, men are more likely to have hypertension. After 39 years of age, women are more likely to have it. Hypertension is especially common in African Americans. This condition often has no signs or symptoms. The cause of the condition is usually not known. Your caregiver can help you come up with a plan to  keep your blood pressure in a normal, healthy range. BLOOD PRESSURE STAGES Blood pressure is classified into four stages: normal, prehypertension, stage 1, and stage 2. Your blood pressure reading will be used to determine what type of treatment, if any, is necessary. Appropriate treatment options are tied to these four stages:  Normal  Systolic pressure (mm Hg): below 120.  Diastolic pressure (mm Hg): below 80. Prehypertension  Systolic pressure (mm Hg): 120 to 139.  Diastolic pressure (mm  Hg): 80 to 89. Stage1  Systolic pressure (mm Hg): 140 to 159.  Diastolic pressure (mm Hg): 90 to 99. Stage2  Systolic pressure (mm Hg): 160 or above.  Diastolic pressure (mm Hg): 100 or above. RISKS RELATED TO HIGH BLOOD PRESSURE Managing your blood pressure is an important responsibility. Uncontrolled high blood pressure can lead to:  A heart attack.  A stroke.  A weakened blood vessel (aneurysm).  Heart failure.  Kidney damage.  Eye damage.  Metabolic syndrome.  Memory and concentration problems. HOW TO MANAGE YOUR BLOOD PRESSURE Blood pressure can be managed effectively with lifestyle changes and medicines (if needed). Your caregiver will help you come up with a plan to bring your blood pressure within a normal range. Your plan should include the following: Education  Read all information provided by your caregivers about how to control blood pressure.  Educate yourself on the latest guidelines and treatment recommendations. New research is always being done to further define the risks and treatments for high blood pressure. Lifestylechanges  Control your weight.  Avoid smoking.  Stay physically active.  Reduce the amount of salt in your diet.  Reduce stress.  Control any chronic conditions, such as high cholesterol or diabetes.  Reduce your alcohol intake. Medicines  Several medicines (antihypertensive medicines) are available, if needed, to bring blood pressure within a normal range. Communication  Review all the medicines you take with your caregiver because there may be side effects or interactions.  Talk with your caregiver about your diet, exercise habits, and other lifestyle factors that may be contributing to high blood pressure.  See your caregiver regularly. Your caregiver can help you create and adjust your plan for managing high blood pressure. RECOMMENDATIONS FOR TREATMENT AND FOLLOW-UP  The following recommendations are based on  current guidelines for managing high blood pressure in nonpregnant adults. Use these recommendations to identify the proper follow-up period or treatment option based on your blood pressure reading. You can discuss these options with your caregiver.  Systolic pressure of 120 to 139 or diastolic pressure of 80 to 89: Follow up with your caregiver as directed.  Systolic pressure of 140 to 160 or diastolic pressure of 90 to 100: Follow up with your caregiver within 2 months.  Systolic pressure above 160 or diastolic pressure above 100: Follow up with your caregiver within 1 month.  Systolic pressure above 180 or diastolic pressure above 110: Consider antihypertensive therapy; follow up with your caregiver within 1 week.  Systolic pressure above 200 or diastolic pressure above 120: Begin antihypertensive therapy; follow up with your caregiver within 1 week. Document Released: 05/30/2012 Document Reviewed: 05/30/2012 [facility] Patient Information 2015 [place], [place]. This information is not intended to replace advice given to you by your health care provider. Make sure you discuss any questions you have with your health care provider.

## 2015-06-22 NOTE — ED Provider Notes (Signed)
CSN: [id number]     Arrival date & time 06/22/15  1013 History   First MD Initiated Contact with Patient 06/22/15 1035     Chief Complaint  Patient presents with  . Otalgia  . Hypertension     (Consider location/radiation/quality/duration/timing/severity/associated sxs/prior Treatment) HPI Theresa Soto is a 39 y.o. female with a history of CAD, hypertension, comes in for evaluation of otalgia and hypertension. Patient states for the past week she has felt like her right ear was becoming intermittently "stopped up". She denies any overt ear pain, otorrhea. No NSAID or aspirin use.  Additionally, patient noted to be hypertensive upon arrival in the ED with most recent blood pressure being 176/114. She attributes this to not having her blood pressure medications. She reports she was originally on her father's insurance which allowed her to get her blood pressure medications, but he recently passed away 3 months ago and she has been without her medications. She denies any headache, vision changes, chest pain, shortness of breath, urinary symptoms, rash. Denies any discomfort now in ED.  Past Medical History  Diagnosis Date  . Hypertensive heart disease     a.  Echo 1/16:  severe LVH, EF 55-60%, severe LAE;  b.  Renal Art [place] 1/16:  no RAS  . CAD (coronary artery disease)     elevated TnI 1/16 in setting of HTN urgency (demand ischemia) >> LHC 1/16:  prox to mid LAD 40%, EF 55%  . Chronic diastolic CHF (congestive heart failure) [facility])    Past Surgical History  Procedure Laterality Date  . None    . Left heart catheterization with coronary angiogram N/A 10/10/2014    Procedure: LEFT HEART CATHETERIZATION WITH CORONARY ANGIOGRAM;  Surgeon: Lesleigh Noe, MD;  Location: [facility] CATH LAB;  Service: Cardiovascular;  Laterality: N/A;   Family History  Problem Relation Age of Onset  . Hypertension Father   . Kidney disease Father   . Heart attack Father    Social History  Substance Use Topics  .  Smoking status: Never Smoker   . Smokeless tobacco: None  . Alcohol Use: 0.0 oz/week    0 Standard drinks or equivalent per week     Comment: Occasional glass of wine   OB History    No data available     Review of Systems A 10 point review of systems was completed and was negative except for pertinent positives and negatives as mentioned in the history of present illness     Allergies  Codeine and Flexeril  Home Medications   Prior to Admission medications   Medication Sig Start Date End Date Taking? Authorizing Provider  ibuprofen (ADVIL,MOTRIN) 200 MG tablet Take 400 mg by mouth every 6 (six) hours as needed for moderate pain.   Yes Historical Provider, MD  amLODipine (NORVASC) 10 MG tablet Take 1 tablet (10 mg total) by mouth daily. 06/22/15   Joycie Peek, PA-C  aspirin EC 81 MG EC tablet Take 1 tablet (81 mg total) by mouth daily. 10/16/14   Maryann Mikhail, DO  carvedilol (COREG) 12.5 MG tablet Take 1 tablet (12.5 mg total) by mouth 2 (two) times daily with a meal. 10/16/14   Maryann Mikhail, DO  ciprofloxacin-dexamethasone (CIPRODEX) otic suspension Place 4 drops into the right ear 2 (two) times daily. Patient not taking: Reported on 06/22/2015 10/16/14   Nita Sells Mikhail, DO  hydrALAZINE (APRESOLINE) 25 MG tablet Take 1 tablet (25 mg total) by mouth every 8 (eight) hours. 10/16/14  Maryann Mikhail, DO  hydrochlorothiazide (HYDRODIURIL) 12.5 MG tablet Take 1 tablet (12.5 mg total) by mouth daily. 06/22/15   Joycie Peek, PA-C  lisinopril (PRINIVIL,ZESTRIL) 20 MG tablet Take 1 tablet (20 mg total) by mouth daily. 10/16/14   Maryann Mikhail, DO  spironolactone (ALDACTONE) 25 MG tablet Take 1 tablet (25 mg total) by mouth daily. 10/16/14   Maryann Mikhail, DO   BP 155/99 mmHg  Pulse 75  Temp(Src) 98.6 F (37 C) (Oral)  Resp 18  SpO2 98% Physical Exam  Constitutional: She is oriented to person, place, and time. She appears well-developed and well-nourished.  Obese  HENT:   Head: Normocephalic and atraumatic.  Mouth/Throat: Oropharynx is clear and moist.  Right TM with mild cerumen, otherwise clear. Left TM normal. No discharge or auricular adenopathy.  Eyes: Conjunctivae and EOM are normal. Pupils are equal, round, and reactive to light. Right eye exhibits no discharge. Left eye exhibits no discharge. No scleral icterus.  Neck: Normal range of motion. Neck supple.  Cardiovascular: Normal rate, regular rhythm and normal heart sounds.  Exam reveals no gallop and no friction rub.   No murmur heard. Pulmonary/Chest: Effort normal and breath sounds normal. No respiratory distress. She has no wheezes. She has no rales.  Abdominal: Soft. There is no tenderness.  Musculoskeletal: She exhibits no tenderness.  Neurological: She is alert and oriented to person, place, and time.  Cranial Nerves II-XII grossly intact  Skin: Skin is warm and dry. No rash noted.  Psychiatric: She has a normal mood and affect.  Nursing note and vitals reviewed.   ED Course  Procedures (including critical care time) Labs Review Labs Reviewed - No data to display  Imaging Review No results found. I have personally reviewed and evaluated these images and lab results as part of my medical decision-making.   EKG Interpretation None     Meds given in ED:  Medications  amLODipine (NORVASC) tablet 5 mg (5 mg Oral Given 06/22/15 1305)    Discharge Medication List as of 06/22/2015 11:22 AM    START taking these medications   Details  hydrochlorothiazide (HYDRODIURIL) 12.5 MG tablet Take 1 tablet (12.5 mg total) by mouth daily., Starting 06/22/2015, Until Discontinued, Print       Filed Vitals:   06/22/15 1026 06/22/15 1037 06/22/15 1305  BP: 174/124 176/114 155/99  Pulse: 84 75   Temp: 98.6 F (37 C)    TempSrc: Oral    Resp: 18 18   SpO2: 100% 98%     MDM  Vitals stable, hypertensive -afebrile Pt resting comfortably in ED. no evidence of hypertensive emergency. No  headache, or vision, chest pain, short of breath, urinary symptoms. PE-physical exam is unremarkable.  DDX-patient given 1 dose of 5 mg amlodipine in the ED and blood pressure responded well. Discharged with prescription for amlodipine and HCTZ. Also given referral to community health and wellness clinic to patient may establish primary care. Discussed using Zyrtec for ear discomfort. No evidence of infection. No evidence of other acute or emergent pathology at this time.  I discussed all relevant lab findings and imaging results with pt and they verbalized understanding. Discussed f/u with PCP within 48 hrs and return precautions, pt very amenable to plan.  Final diagnoses:  Essential hypertension  Otalgia, right       Joycie Peek, PA-C 06/22/15 1734  Leta Baptist, MD 06/22/15 2133

## 2015-06-22 NOTE — ED Notes (Addendum)
Pt c/o rt ear being "stopped up" x 1 wk.  States that she has been without her BP meds x 2 months due to dad dying.  States she was on his insurance but now that he is dead, she has no insurance to get her BP meds.  BP was initially 199/137 upon pt's arrival to ER.  Pt was on 3 different BP meds.  Hx of CHF and CAD w/ hypertensive urgency/demand ischemia.  When reviewing hx, pt states that she was not aware of these medical problems.

## 2015-06-22 NOTE — ED Notes (Signed)
Pt reported rt ear pain with drainage. Noted redness to TM on rt ear and lt ear TM pearly gray. BP 176/114 with slight headache but denies visual disturbances, nausea and dizziness/lightheadedness.

## 2015-09-02 ENCOUNTER — Encounter (HOSPITAL_COMMUNITY): Payer: Self-pay | Admitting: Emergency Medicine

## 2015-09-02 ENCOUNTER — Emergency Department (HOSPITAL_COMMUNITY)
Admission: EM | Admit: 2015-09-02 | Discharge: 2015-09-02 | Disposition: A | Discharge status: Home / Self Care | Payer: Self-pay | Attending: Emergency Medicine | Admitting: Emergency Medicine

## 2015-09-02 DIAGNOSIS — K002 Abnormalities of size and form of teeth: Secondary | ICD-10-CM | POA: Insufficient documentation

## 2015-09-02 DIAGNOSIS — I110 Hypertensive heart disease with heart failure: Secondary | ICD-10-CM | POA: Insufficient documentation

## 2015-09-02 DIAGNOSIS — I2510 Atherosclerotic heart disease of native coronary artery without angina pectoris: Secondary | ICD-10-CM | POA: Insufficient documentation

## 2015-09-02 DIAGNOSIS — R110 Nausea: Secondary | ICD-10-CM | POA: Insufficient documentation

## 2015-09-02 DIAGNOSIS — K0889 Other specified disorders of teeth and supporting structures: Secondary | ICD-10-CM | POA: Insufficient documentation

## 2015-09-02 DIAGNOSIS — K029 Dental caries, unspecified: Secondary | ICD-10-CM | POA: Insufficient documentation

## 2015-09-02 DIAGNOSIS — Z7982 Long term (current) use of aspirin: Secondary | ICD-10-CM | POA: Insufficient documentation

## 2015-09-02 DIAGNOSIS — Z79899 Other long term (current) drug therapy: Secondary | ICD-10-CM | POA: Insufficient documentation

## 2015-09-02 DIAGNOSIS — K0381 Cracked tooth: Secondary | ICD-10-CM | POA: Insufficient documentation

## 2015-09-02 DIAGNOSIS — I5032 Chronic diastolic (congestive) heart failure: Secondary | ICD-10-CM | POA: Insufficient documentation

## 2015-09-02 DIAGNOSIS — Z9889 Other specified postprocedural states: Secondary | ICD-10-CM | POA: Insufficient documentation

## 2015-09-02 DIAGNOSIS — Z760 Encounter for issue of repeat prescription: Secondary | ICD-10-CM | POA: Insufficient documentation

## 2015-09-02 LAB — I-STAT CHEM 8, ED
BUN: 11 mg/dL (ref 6–20)
CREATININE: 1.1 mg/dL — AB (ref 0.44–1.00)
Calcium, Ion: 1.22 mmol/L (ref 1.12–1.23)
Chloride: 101 mmol/L (ref 101–111)
Glucose, Bld: 129 mg/dL — ABNORMAL HIGH (ref 65–99)
HCT: 44 % (ref 36.0–46.0)
Hemoglobin: 15 g/dL (ref 12.0–15.0)
POTASSIUM: 3.5 mmol/L (ref 3.5–5.1)
Sodium: 139 mmol/L (ref 135–145)
TCO2: 29 mmol/L (ref 0–100)

## 2015-09-02 MED ORDER — HYDROCHLOROTHIAZIDE 12.5 MG PO CAPS
12.5000 mg | ORAL_CAPSULE | Freq: Once | ORAL | Status: AC
  Administered 2015-09-02: 12.5 mg via ORAL
  Filled 2015-09-02: qty 1

## 2015-09-02 MED ORDER — AMLODIPINE BESYLATE 10 MG PO TABS
10.0000 mg | ORAL_TABLET | Freq: Once | ORAL | Status: AC
  Administered 2015-09-02: 10 mg via ORAL
  Filled 2015-09-02: qty 1

## 2015-09-02 MED ORDER — PENICILLIN V POTASSIUM 500 MG PO TABS: 500.0000 mg | ORAL_TABLET | Freq: Four times a day (QID) | ORAL | Status: AC

## 2015-09-02 MED ORDER — OXYCODONE HCL 5 MG PO TABS
5.0000 mg | ORAL_TABLET | Freq: Once | ORAL | Status: AC
  Administered 2015-09-02: 5 mg via ORAL
  Filled 2015-09-02: qty 1

## 2015-09-02 MED ORDER — HYDROCHLOROTHIAZIDE 12.5 MG PO TABS: 12.5000 mg | ORAL_TABLET | Freq: Every day | ORAL | Status: DC

## 2015-09-02 MED ORDER — OXYCODONE HCL 5 MG PO TABS: 5.0000 mg | ORAL_TABLET | ORAL | Status: DC | PRN

## 2015-09-02 MED ORDER — AMLODIPINE BESYLATE 10 MG PO TABS: 10.0000 mg | ORAL_TABLET | Freq: Every day | ORAL | Status: DC

## 2015-09-02 NOTE — Discharge Instructions (Signed)
Take your medications as prescribed. You may also continue taking ibuprofen as prescribed over-the-counter as needed for pain relief. I also recommend applying ice for 10-15 minutes 3-4 times daily to affected area for pain relief. Please follow up with a primary care provider from the Resource Guide provided below in 1 week. Please return to the Emergency Department if symptoms worsen or new onset of fever, facial/neck swelling, drainage, difficulty swallowing, difficulty breathing, chest pain, headache, change in vision, lightheadedness, dizziness, syncope.  [facility] Community Service Learning [facility] [address] [place], [place] [address] Phone [phone]  [facility] [employer] [facility] in [place], [place], exemplifies the [employer] vision to improve the health and quality of life of all [place]ians by Public house manager with a passion to care for the underserved and by leading the nation in community-based, service learning oral health education.  We are committed to offering comprehensive general dental services for adults, children and special needs patients in a safe, caring and professional setting.   Appointments: Our clinic is open Monday through Friday 8:00 a.m. until 5:00 p.m. The amount of time scheduled for an appointment depends on the patients specific needs. We ask that you keep your appointed time for care or provide 24-hour notice of all appointment changes. Parents or legal guardians must accompany minor children.   Payment for Services: Medicaid and other insurance plans are welcome. Payment for services is due when services are rendered and may be made by cash or credit card. If you have dental insurance, we will assist you with your claim submission.    Emergencies:  Emergency services will be provided Monday through Friday on a  walk-in basis.  Please arrive early for emergency services. After hours emergency services will be provided for patients of record as required.   Services:  Comprehensive General Dentistry Childrens Dentistry Oral Surgery - Extractions Root Canals Sealants and Tooth Colored Fillings Crowns and Bridges Dentures and Partial Dentures Implant Services Periodontal Services and Retail buyer 3-D/Cone Beam Imaging    Emergency Department Resource Guide 1) Find a Doctor and Pay Out of Pocket Although you won't have to find out who is covered by your insurance plan, it is a good idea to ask around and get recommendations. You will then need to call the office and see if the doctor you have chosen will accept you as a new patient and what types of options they offer for patients who are self-pay. Some doctors offer discounts or will set up payment plans for their patients who do not have insurance, but you will need to ask so you aren't surprised when you get to your appointment.  2) Contact Your Local Health Department Not all health departments have doctors that can see patients for sick visits, but many do, so it is worth a call to see if yours does. If you don't know where your local health department is, you can check in your phone book. The CDC also has a tool to help you locate your state's health department, and many state websites also have listings of all of their local health departments.  3) Find a Walk-in Clinic If your illness is not likely to be very severe or complicated, you may want to try a walk in clinic. These are popping up all over the country in pharmacies, drugstores, and shopping centers. They're usually staffed by nurse practitioners or  physician assistants that have been trained to treat common illnesses and complaints. They're usually fairly quick and inexpensive. However, if you have serious medical issues or chronic medical  problems, these are probably not your best option.  No Primary Care Doctor: - Call Health Connect at  [phone] - they can help you locate a primary care doctor that  accepts your insurance, provides certain services, etc. - Physician Referral Service- [phone]  Chronic Pain Problems: Organization         Address  Phone   Notes  [facility]  [phone] Patients need to be referred by their primary care doctor.   Medication Assistance: Organization         Address  Phone   Notes  [facility] Medication [facility] [address] [place], [place] [address] [phone] --Must be a resident of [place] [facility] -- Must have NO insurance coverage whatsoever (no Medicaid/ Medicare, etc.) -- The pt. MUST have a primary care doctor that directs their care regularly and follows them in the community   MedAssist  [phone]) [phone]   [employer]  [phone]    Agencies that provide inexpensive medical care: Organization         Address  Phone   Notes  [facility]  [phone]) [phone]   [facility]    [phone]   [facility] [address] [place], [place] [address] [phone]) [phone]   [facility] [address], [place] [phone]   Planned Parenthood    [phone]   [facility]    [phone]) [phone]   [facility] and [facility]  [address], [place] Phone:  [phone]) [phone], Fax:  [phone] Hours of Operation:  9 am - 6 pm, M-F.  Also accepts Medicaid/Medicare and self-pay.  [facility]  [address], [place] Phone: [phone], Fax: [phone]) [phone]. Hours of Operation:  8:30 am - 5:30 pm, M-F.  Also accepts Medicaid and self-pay.  [facility] [place] [address], [place] Phone: [phone](336) [phone]   [facility] [address], [place], [place] [phone], Ext. [phone]  Mondays & Thursdays: 7-9 AM.  First 15 patients are seen on a first come, first serve basis.    Medicaid-accepting [facility] Providers:  Organization         Address  Phone   Notes  [facility] [address], [place] [phone] Also accepts self-pay patients.  [facility] [address], [place]  [phone]) [phone]   [facility] [address], [place] [phone]) [phone]   [facility] [address] [place] [address], [place] [phone]) [phone]   Renaye RakersVeita Bland [address], [place]   [phone] Only accepts [place] Access [place] patients after they have their name applied to their card.   Self-Pay (no insurance) in [facility]:  Organization         Address  Phone   Notes  Sickle Cell Patients, [facility] [address], [place] [phone]   [facility] [address], [place] [phone]) [phone]   [facility] [place]  [address],  [phone]) [phone]   [facility]/Dr. Julio Sickssei-Bonsu  [address] [place]  [place] [address], [place] or [address], [place] [phone] Phone number for both [place] and [place] locations is the same.  [facility]gent [facility] and [facility] [address] [place] [phone]   [facility] [address] [place] or [address] [phone]   [facility] [address] [place] [phone], phone; [phone], fax Sees patients 1st and 3rd Saturday of every month.  Must not qualify for public or private insurance (i.e. Medicaid, Medicare, [place] Health Choice, Veterans' Benefits)  Household income should be no more than 200% of the poverty level The clinic cannot treat you if you are pregnant or think you are pregnant  Sexually transmitted diseases are not treated at  the clinic.    Dental Care: Organization         Address  Phone  Notes  [facility] of [facility] [address] [place], [place] [phone] Accepts children up to age 50 who are enrolled in [place] or [place] Health Choice; pregnant women with a Medicaid card; and children who have applied for Medicaid or [place] Health Choice, but were declined, whose parents can pay a reduced fee at time of service.  [facility] Department of [facility]  [address], [place] [phone] Accepts children up to age 85 who are enrolled in [place] or [place] Health Choice; pregnant women with a Medicaid card; and children who have applied for Medicaid or [place] Health Choice, but were declined, whose parents can pay a reduced fee at time of service.  [facility]  [address] [place], [place] [phone] Patients are seen by appointment only. Walk-ins are not accepted. [facility] will see patients 9 years of age and older. Monday - Tuesday (8am-5pm) Most Wednesdays (8:30-5pm) $30 per visit, cash only  [facility] Access PROGRAM  [address], [facility] [phone] Patients are seen by appointment only. Walk-ins are not accepted. [facility] will see patients 64 years of age and older. One Wednesday Evening (Monthly: Volunteer Based).  $30 per visit, cash only  [employer]  [phone] for adults; Children under age 31, call Graduate Pediatric Dentistry at [phone]. Children aged 24-14, please call [phone] to request a pediatric application.  Dental services are provided in all areas of dental care including fillings, crowns and bridges, complete and partial dentures, implants, gum treatment, root canals, and extractions. Preventive care is also provided. Treatment is provided to both adults and children. Patients are selected via a lottery and there is often a  waiting list.   [facility] [address] [place]  [phone] [email or web address]   [facility] [address] [place], [place] [phone], Ext. [phone] Second and Fourth Thursday of each month, opens at 6:30 AM; Clinic ends at 9 AM.  Patients are seen on a first-come first-served basis, and a limited number are seen during each clinic.   [facility]  [address] [place], [place] [phone]   Eligibility Requirements You must have lived in [place], [place], or [place] counties for at least the last three months.   You cannot be eligible for state or federal sponsored [place], including [employer], [place], or [employer].   You generally cannot be eligible for healthcare insurance through your employer.    How  to apply: Eligibility screenings are held every Tuesday and Wednesday afternoon from 1:00 pm until 4:00 pm. You do not need an appointment for the interview!  [facility] [address] [place], [place] [phone]   [facility] Health Department  [phone]   [facility] Health Department  [phone]   [facility] Health Department  [phone]    Behavioral Health Resources in the Community: Intensive Outpatient Programs Organization         Address  Phone  Notes  [facility] [address] [place], [place] [phone]   [facility] [address] [place], [place] [phone]   [facility] [address], [place], [place]  [phone]   [facility] [address],  [place], [place] [phone] or [phone]   Substance Abuse Resources Organization         Address  Phone  Notes  Alcohol [facility] Drug Services  [phone]   [facility]  [phone]   [facility]  [phone]   Floydene Flock  [phone]   Residential & Outpatient Substance Abuse  Program  [phone]   Psychological Services Organization         Address  Phone  Notes  [facility] Behavioral Health  [phone]   [facility]  [phone]   [facility] Mental Health [address] [place] [phone] or [phone]    Mobile Crisis Teams Organization         Address  Phone  Notes  [facility], Mobile Crisis Care Unit  [phone]   [facility]  [address] [place], [place] [phone]   Doristine Locks [address] [place] [phone]    Self-Help/Support Groups Organization         Address  Phone             Notes  [facility] [place] - variety of support groups  336- I7437963 Call for more information  Narcotics Anonymous (NA), [facility] [address], [employer]  2 meetings at this location   Statistician         Address  Phone  Notes  [facility] [address],    [place]  [phone]   [facility]  [address] [place] [address], [place], [place] [phone]   [facility] [address] [place], [place] [phone] Admissions: 8am-3pm M-F  [facility] [address],    [place], [place] [phone]   [facility] [address] [place], [place], [place] [phone]   [facility] [address]  [place], [place] [phone]   [facility] - Intensive Outpatient [address], [place], [place] [phone]   [facility] ([facility] [address]  [place], [place] [phone] or [phone]   [facility] ([facility]) [address], [place], [place] [phone] Accepts Medicaid  [facility] [address]  [place] [phone] Substance Abuse/Addiction Treatment   [facility] Organization          Address  Phone  Notes  [facility]  [phone]   Angie Fava, PhD [address] [place] [place], [place]   [phone] or [phone]   [facility] Behavioral   [address] [place], [place] (781) [phone]   [facility] [address],  [place], [place] [phone] Insurance/Medicaid/sponsorship through [facility] [address]                                    [place], [place] [phone] Therapy/tele-psych/case  [facility] [address]   [place], [place] [phone]    Dr. Lolly Mustache  [phone]   [facility]  [employer] [facility] Dept. 1) [address], [place] 2) [address], [place] 3)  [address], [place] [phone]  [phone]   [facility] Child Abuse Hotline (512) [phone] or [phone] (After Hours)

## 2015-09-02 NOTE — ED Notes (Signed)
Pt c/o dental pain x 1 wk.  Pt states that she is out of her hypertension meds because of insurance and would like a refill.

## 2015-09-02 NOTE — ED Provider Notes (Signed)
CSN: [id number]     Arrival date & time 09/02/15  1237 History  By signing my name below, I, Octavia Heirrianna Nassar, attest that this documentation has been prepared under the direction and in the presence of Melburn HakeNicole Nadeau, PA-C. Electronically Signed: Octavia HeirArianna Nassar, ED Scribe. 09/02/2015. 1:38 PM.    Chief Complaint  Patient presents with  . Dental Pain  . Medication Refill      The history is provided by the patient. No language interpreter was used.   HPI Comments: Theresa Soto is a 39 y.o. female who has a PMHx of HTN who presents to the Emergency Department complaining of constant, moderate, gradual worsening upper right sided dental pain onset one week ago with increased pain over the past 3 days. She has been having associated intermittent nausea. Pt reports having a broken tooth that has been broken off more and has gone into her nerve. She states that it is painful to eat and sleep due to pain.She notes taking tylenol and ibuprofen to alleviate the pain with no relief. She denies fever, facial swelling, neck swelling, difficulty swallowing.  Pt also presents to the ED requesting a medication refill for her hypertension medication. She notes she has been out of her medication for the past month. Denies headache, difficulty breathing, visual changes, chest pain, abdominal pain, vomiting, diarrhea, numbness, tingling, weakness.  Past Medical History  Diagnosis Date  . Hypertensive heart disease     a.  Echo 1/16:  severe LVH, EF 55-60%, severe LAE;  b.  Renal Art [place] 1/16:  no RAS  . CAD (coronary artery disease)     elevated TnI 1/16 in setting of HTN urgency (demand ischemia) >> LHC 1/16:  prox to mid LAD 40%, EF 55%  . Chronic diastolic CHF (congestive heart failure) [facility](HCC)    Past Surgical History  Procedure Laterality Date  . None    . Left heart catheterization with coronary angiogram N/A 10/10/2014    Procedure: LEFT HEART CATHETERIZATION WITH CORONARY ANGIOGRAM;  Surgeon: Lesleigh NoeHenry W  Smith III, MD;  [facility] LAB;  Service: Cardiovascular;  Laterality: N/A;   Family History  Problem Relation Age of Onset  . Hypertension Father   . Kidney disease Father   . Heart attack Father    Social History  Substance Use Topics  . Smoking status: Never Smoker   . Smokeless tobacco: None  . Alcohol Use: 0.0 oz/week    0 Standard drinks or equivalent per week     Comment: Occasional glass of wine   OB History    No data available     Review of Systems  Constitutional: Negative for fever.  HENT: Positive for dental problem. Negative for facial swelling, postnasal drip and trouble swallowing.   Eyes: Negative for visual disturbance.  Respiratory: Negative for shortness of breath.   Cardiovascular: Negative for chest pain.  Gastrointestinal: Positive for nausea.  Neurological: Negative for weakness and numbness.  All other systems reviewed and are negative.     Allergies  Codeine and Flexeril  Home Medications   Prior to Admission medications   Medication Sig Start Date End Date Taking? Authorizing Provider  amLODipine (NORVASC) 10 MG tablet Take 1 tablet (10 mg total) by mouth daily. 09/02/15   Barrett HenleNicole Elizabeth Nadeau, PA-C  aspirin EC 81 MG EC tablet Take 1 tablet (81 mg total) by mouth daily. 10/16/14   Maryann Mikhail, DO  carvedilol (COREG) 12.5 MG tablet Take 1 tablet (12.5 mg total) by mouth  2 (two) times daily with a meal. 10/16/14   Maryann Mikhail, DO  ciprofloxacin-dexamethasone (CIPRODEX) otic suspension Place 4 drops into the right ear 2 (two) times daily. Patient not taking: Reported on 06/22/2015 10/16/14   Nita Sells Mikhail, DO  hydrALAZINE (APRESOLINE) 25 MG tablet Take 1 tablet (25 mg total) by mouth every 8 (eight) hours. 10/16/14   Maryann Mikhail, DO  hydrochlorothiazide (HYDRODIURIL) 12.5 MG tablet Take 1 tablet (12.5 mg total) by mouth daily. 09/02/15   Barrett Henle, PA-C  ibuprofen (ADVIL,MOTRIN) 200 MG tablet Take 400 mg by mouth  every 6 (six) hours as needed for moderate pain.    Historical Provider, MD  lisinopril (PRINIVIL,ZESTRIL) 20 MG tablet Take 1 tablet (20 mg total) by mouth daily. 10/16/14   Maryann Mikhail, DO  oxyCODONE (ROXICODONE) 5 MG immediate release tablet Take 1 tablet (5 mg total) by mouth every 4 (four) hours as needed for severe pain. 09/02/15   Barrett Henle, PA-C  penicillin v potassium (VEETID) 500 MG tablet Take 1 tablet (500 mg total) by mouth 4 (four) times daily. 09/02/15 09/09/15  Barrett Henle, PA-C  spironolactone (ALDACTONE) 25 MG tablet Take 1 tablet (25 mg total) by mouth daily. 10/16/14   Maryann Mikhail, DO   Triage vitals: BP 169/115 mmHg  Pulse 104  Temp(Src) 98 F (36.7 C) (Oral)  Resp 16  SpO2 100% Physical Exam  Constitutional: She appears well-developed and well-nourished. No distress.  HENT:  Head: Normocephalic and atraumatic.  Mouth/Throat: Uvula is midline, oropharynx is clear and moist and mucous membranes are normal. No oral lesions. No trismus in the jaw. Abnormal dentition. Dental caries present. No dental abscesses or uvula swelling. No oropharyngeal exudate.  Poor dentition noted throughout with multiple extracted molars and dental carries, tooth #14 is TTP, tooth is decayed down to gumline with mild swelling and erythema noted to surrounding to gingiva, no drainage, induration or fluctuance, no facial or neck swelling.  Eyes: Conjunctivae and EOM are normal. Right eye exhibits no discharge. Left eye exhibits no discharge.  Neck: Normal range of motion. Neck supple.  Cardiovascular: Normal rate, regular rhythm, normal heart sounds and intact distal pulses.   Pulmonary/Chest: Effort normal and breath sounds normal. No stridor. No respiratory distress.  Lymphadenopathy:    She has no cervical adenopathy.  Neurological: She is alert. Coordination normal.  Skin: No rash noted. She is not diaphoretic.  Psychiatric: She has a normal mood and affect.  Her behavior is normal.  Nursing note and vitals reviewed.   ED Course  Procedures  DIAGNOSTIC STUDIES: Oxygen Saturation is 100% on RA, normal by my interpretation.  COORDINATION OF CARE: 1:30 PM Discussed treatment plan with pt at bedside and pt agreed to plan.  Labs Review Labs Reviewed  I-STAT CHEM 8, ED - Abnormal; Notable for the following:    Creatinine, Ser 1.10 (*)    Glucose, Bld 129 (*)    All other components within normal limits    Imaging Review No results found. I have personally reviewed and evaluated these images and lab results as part of my medical decision-making.  Filed Vitals:   09/02/15 1242 09/02/15 1246  BP: 177/120 169/115  Pulse: 111 104  Temp: 98 F (36.7 C)   Resp: 20 16     MDM   Final diagnoses:  Pain, dental  Medication refill    Patient presents with worsening dental pain that she has had for the past week. She also states she is out  of her blood pressure medication. VSS. Exam revealed tenderness to tooth #14 with mild erythema and swelling to surrounding gingiva, no evidence of dental abscess, heart and lung exam unremarkable, no signs of end organ damage. Chem 8 revealed creatinine 1.1, otherwise unremarkable, no electrolyte abnormalities. Patient given pain meds and an initial dose of her hypertensive medications in the ED. Plan to discharge patient home with pain meds and penicillin for dental infection/pain. Patient given prescription for blood pressure medications and advised patient to follow up with PCP, she was given resource guide in the ED.  Evaluation does not show pathology requring ongoing emergent intervention or admission. Pt is hemodynamically stable and mentating appropriately. Discussed findings/results and plan with patient/guardian, who agrees with plan. All questions answered. Return precautions discussed and outpatient follow up given.    I personally performed the services described in this documentation, which was  scribed in my presence. The recorded information has been reviewed and is accurate.    Satira Sark [place], [place] 09/02/15 1407  Azalia Bilis, MD 09/02/15 1409

## 2015-11-11 ENCOUNTER — Encounter (HOSPITAL_COMMUNITY): Payer: Self-pay | Admitting: Emergency Medicine

## 2015-11-11 DIAGNOSIS — Z3202 Encounter for pregnancy test, result negative: Secondary | ICD-10-CM | POA: Insufficient documentation

## 2015-11-11 DIAGNOSIS — I2510 Atherosclerotic heart disease of native coronary artery without angina pectoris: Secondary | ICD-10-CM | POA: Insufficient documentation

## 2015-11-11 DIAGNOSIS — Z9889 Other specified postprocedural states: Secondary | ICD-10-CM | POA: Insufficient documentation

## 2015-11-11 DIAGNOSIS — I110 Hypertensive heart disease with heart failure: Secondary | ICD-10-CM | POA: Insufficient documentation

## 2015-11-11 DIAGNOSIS — Z79899 Other long term (current) drug therapy: Secondary | ICD-10-CM | POA: Insufficient documentation

## 2015-11-11 DIAGNOSIS — I5032 Chronic diastolic (congestive) heart failure: Secondary | ICD-10-CM | POA: Insufficient documentation

## 2015-11-11 DIAGNOSIS — R110 Nausea: Secondary | ICD-10-CM | POA: Insufficient documentation

## 2015-11-11 DIAGNOSIS — R197 Diarrhea, unspecified: Secondary | ICD-10-CM | POA: Insufficient documentation

## 2015-11-11 DIAGNOSIS — Z9119 Patient's noncompliance with other medical treatment and regimen: Secondary | ICD-10-CM | POA: Insufficient documentation

## 2015-11-11 DIAGNOSIS — Z7982 Long term (current) use of aspirin: Secondary | ICD-10-CM | POA: Insufficient documentation

## 2015-11-11 LAB — CBC WITH DIFFERENTIAL/PLATELET
BASOS ABS: 0 10*3/uL (ref 0.0–0.1)
Basophils Relative: 0 %
EOS ABS: 0.1 10*3/uL (ref 0.0–0.7)
Eosinophils Relative: 1 %
HCT: 39.3 % (ref 36.0–46.0)
Hemoglobin: 12.7 g/dL (ref 12.0–15.0)
Lymphocytes Relative: 38 %
Lymphs Abs: 2.7 10*3/uL (ref 0.7–4.0)
MCH: 26.3 pg (ref 26.0–34.0)
MCHC: 32.3 g/dL (ref 30.0–36.0)
MCV: 81.4 fL (ref 78.0–100.0)
Monocytes Absolute: 0.4 10*3/uL (ref 0.1–1.0)
Monocytes Relative: 6 %
Neutro Abs: 3.8 10*3/uL (ref 1.7–7.7)
Neutrophils Relative %: 55 %
Platelets: 303 10*3/uL (ref 150–400)
RBC: 4.83 MIL/uL (ref 3.87–5.11)
RDW: 14.4 % (ref 11.5–15.5)
WBC: 7 10*3/uL (ref 4.0–10.5)

## 2015-11-11 LAB — COMPREHENSIVE METABOLIC PANEL
ALBUMIN: 3.7 g/dL (ref 3.5–5.0)
ALK PHOS: 52 U/L (ref 38–126)
ALT: 15 U/L (ref 14–54)
ANION GAP: 12 (ref 5–15)
AST: 23 U/L (ref 15–41)
BUN: 12 mg/dL (ref 6–20)
CO2: 23 mmol/L (ref 22–32)
Calcium: 9.1 mg/dL (ref 8.9–10.3)
Chloride: 102 mmol/L (ref 101–111)
Creatinine, Ser: 1.17 mg/dL — ABNORMAL HIGH (ref 0.44–1.00)
GFR calc Af Amer: >60 mL/min (ref >60)
GFR calc non Af Amer: 58 mL/min — ABNORMAL LOW (ref >60)
GLUCOSE: 135 mg/dL — AB (ref 65–99)
Potassium: 3.6 mmol/L (ref 3.5–5.1)
SODIUM: 137 mmol/L (ref 135–145)
Total Bilirubin: 0.3 mg/dL (ref 0.3–1.2)
Total Protein: 7.2 g/dL (ref 6.5–8.1)

## 2015-11-11 LAB — POC URINE PREG, ED: PREG TEST UR: NEGATIVE

## 2015-11-11 LAB — URINALYSIS, ROUTINE W REFLEX MICROSCOPIC
Bilirubin Urine: NEGATIVE
GLUCOSE, UA: NEGATIVE mg/dL
Hgb urine dipstick: NEGATIVE
Ketones, ur: NEGATIVE mg/dL
LEUKOCYTES UA: NEGATIVE
Nitrite: NEGATIVE
PH: 5 (ref 5.0–8.0)
PROTEIN: NEGATIVE mg/dL
Specific Gravity, Urine: 1.015 (ref 1.005–1.030)

## 2015-11-11 NOTE — ED Notes (Signed)
Pt. reports fatigue , generalized weakness ,and nausea onset this week , hypertensive at triage .

## 2015-11-12 ENCOUNTER — Emergency Department (HOSPITAL_COMMUNITY)
Admission: EM | Admit: 2015-11-12 | Discharge: 2015-11-12 | Disposition: A | Discharge status: Home / Self Care | Payer: MEDICAID | Attending: Emergency Medicine | Admitting: Emergency Medicine

## 2015-11-12 DIAGNOSIS — R110 Nausea: Secondary | ICD-10-CM

## 2015-11-12 DIAGNOSIS — I10 Essential (primary) hypertension: Secondary | ICD-10-CM

## 2015-11-12 DIAGNOSIS — R197 Diarrhea, unspecified: Secondary | ICD-10-CM

## 2015-11-12 MED ORDER — HYDROCHLOROTHIAZIDE 25 MG PO TABS: 25.0000 mg | ORAL_TABLET | Freq: Every day | ORAL | Status: DC

## 2015-11-12 MED ORDER — METOPROLOL TARTRATE 25 MG PO TABS: 25.0000 mg | ORAL_TABLET | Freq: Two times a day (BID) | ORAL | Status: DC

## 2015-11-12 MED ORDER — HYDROCHLOROTHIAZIDE 25 MG PO TABS
25.0000 mg | ORAL_TABLET | Freq: Every day | ORAL | Status: DC
  Administered 2015-11-12: 25 mg via ORAL
  Filled 2015-11-12: qty 1

## 2015-11-12 MED ORDER — ONDANSETRON 4 MG PO TBDP
8.0000 mg | ORAL_TABLET | Freq: Once | ORAL | Status: AC
  Administered 2015-11-12: 8 mg via ORAL
  Filled 2015-11-12: qty 2

## 2015-11-12 MED ORDER — METOPROLOL TARTRATE 25 MG PO TABS
25.0000 mg | ORAL_TABLET | Freq: Once | ORAL | Status: AC
  Administered 2015-11-12: 25 mg via ORAL
  Filled 2015-11-12: qty 1

## 2015-11-12 MED ORDER — ACETAMINOPHEN 500 MG PO TABS
1000.0000 mg | ORAL_TABLET | Freq: Once | ORAL | Status: AC
  Administered 2015-11-12: 1000 mg via ORAL
  Filled 2015-11-12: qty 2

## 2015-11-12 MED ORDER — PROMETHAZINE HCL 25 MG PO TABS: 25.0000 mg | ORAL_TABLET | Freq: Four times a day (QID) | ORAL | Status: DC | PRN

## 2015-11-12 NOTE — ED Provider Notes (Signed)
CSN: [id number]     Arrival date & time 11/11/15  2257 History  By signing my name below, I, Theresa Soto, attest that this documentation has been prepared under the direction and in the presence of Azalia Bilis, MD . Electronically Signed: Freida Soto, Scribe. 11/12/2015. 12:26 AM.    Chief Complaint  Patient presents with  . Hypertension  . Fatigue  . Nausea    The history is provided by the patient. No language interpreter was used.    HPI Comments:  Theresa Soto is a 40 y.o. female with a history of HTN, & CAD, who presents to the Emergency Department complaining of generalized weakness x a few days. She reports associated intermittent episodes of fatigue, nausea, mild HA, and mild diarrhea. She denies vomiting,  change in appetite, and sick contacts at home. No alleviating factors noted. Her LNMP was Oct 11, 2015. She also denies abnormal vaginal bleeding, dysuria, hematuria, and urgency. Pt was on Metoprolol BID and Norvasc for her HTN but states it was not working, however she stopped taking it because she's had trouble making an appointment at the wellness center.   No PCP  Past Medical History  Diagnosis Date  . Hypertensive heart disease     a.  Echo 1/16:  severe LVH, EF 55-60%, severe LAE;  b.  Renal Art [place] 1/16:  no RAS  . CAD (coronary artery disease)     elevated TnI 1/16 in setting of HTN urgency (demand ischemia) >> LHC 1/16:  prox to mid LAD 40%, EF 55%  . Chronic diastolic CHF (congestive heart failure) [facility])    Past Surgical History  Procedure Laterality Date  . None    . Left heart catheterization with coronary angiogram N/A 10/10/2014    Procedure: LEFT HEART CATHETERIZATION WITH CORONARY ANGIOGRAM;  Surgeon: Lesleigh Noe, MD;  Location: [facility] CATH LAB;  Service: Cardiovascular;  Laterality: N/A;   Family History  Problem Relation Age of Onset  . Hypertension Father   . Kidney disease Father   . Heart attack Father    Social History  Substance Use  Topics  . Smoking status: Never Smoker   . Smokeless tobacco: None  . Alcohol Use: 0.0 oz/week    0 Standard drinks or equivalent per week     Comment: Occasional glass of wine   OB History    No data available     Review of Systems  10 systems reviewed and all are negative for acute change except as noted in the HPI.  Allergies  Codeine and Flexeril  Home Medications   Prior to Admission medications   Medication Sig Start Date End Date Taking? Authorizing Provider  amLODipine (NORVASC) 10 MG tablet Take 1 tablet (10 mg total) by mouth daily. 09/02/15   Barrett Henle, PA-C  aspirin EC 81 MG EC tablet Take 1 tablet (81 mg total) by mouth daily. 10/16/14   Maryann Mikhail, DO  carvedilol (COREG) 12.5 MG tablet Take 1 tablet (12.5 mg total) by mouth 2 (two) times daily with a meal. 10/16/14   Maryann Mikhail, DO  ciprofloxacin-dexamethasone (CIPRODEX) otic suspension Place 4 drops into the right ear 2 (two) times daily. Patient not taking: Reported on 06/22/2015 10/16/14   Nita Sells Mikhail, DO  hydrALAZINE (APRESOLINE) 25 MG tablet Take 1 tablet (25 mg total) by mouth every 8 (eight) hours. 10/16/14   Maryann Mikhail, DO  hydrochlorothiazide (HYDRODIURIL) 12.5 MG tablet Take 1 tablet (12.5 mg total) by mouth daily. 09/02/15  Satira Sark Nadeau, PA-C  ibuprofen (ADVIL,MOTRIN) 200 MG tablet Take 400 mg by mouth every 6 (six) hours as needed for moderate pain.    Historical Provider, MD  lisinopril (PRINIVIL,ZESTRIL) 20 MG tablet Take 1 tablet (20 mg total) by mouth daily. 10/16/14   Maryann Mikhail, DO  oxyCODONE (ROXICODONE) 5 MG immediate release tablet Take 1 tablet (5 mg total) by mouth every 4 (four) hours as needed for severe pain. 09/02/15   Barrett Henle, PA-C  spironolactone (ALDACTONE) 25 MG tablet Take 1 tablet (25 mg total) by mouth daily. 10/16/14   Maryann Mikhail, DO   BP 220/118 mmHg  Pulse 96  Temp(Src) 99.4 F (37.4 C) (Oral)  Resp 16  Ht   (1.676 m)  Wt 269 lb (122.018 kg)  BMI 43.44 kg/m2  SpO2 99%  LMP 10/09/2015 (Approximate) Physical Exam  Constitutional: She is oriented to person, place, and time. She appears well-developed and well-nourished. No distress.  HENT:  Head: Normocephalic and atraumatic.  Eyes: EOM are normal.  Neck: Normal range of motion.  Cardiovascular: Normal rate, regular rhythm and normal heart sounds.   Pulmonary/Chest: Effort normal and breath sounds normal.  Abdominal: Soft. She exhibits no distension. There is no tenderness.  Musculoskeletal: Normal range of motion.  Neurological: She is alert and oriented to person, place, and time.  Skin: Skin is warm and dry.  Psychiatric: She has a normal mood and affect. Judgment normal.  Nursing note and vitals reviewed.   ED Course  Procedures   DIAGNOSTIC STUDIES:  Oxygen Saturation is 99% on RA, normal by my interpretation.    COORDINATION OF CARE:  12:21 AM Will order nausea med in ED and discharge with Rx for HCTZ and metropolol. Instructed to follow up with PCP.  Discussed treatment plan with pt at bedside and pt agreed to plan.  Labs Review Labs Reviewed  COMPREHENSIVE METABOLIC PANEL - Abnormal; Notable for the following:    Glucose, Bld 135 (*)    Creatinine, Ser 1.17 (*)    GFR calc non Af Amer 58 (*)    All other components within normal limits  CBC WITH DIFFERENTIAL/PLATELET  URINALYSIS, ROUTINE W REFLEX MICROSCOPIC (NOT AT [facility])  POC URINE PREG, ED   I have personally reviewed and evaluated these lab results as part of my medical decision-making.    MDM   Final diagnoses:  Essential hypertension  Nausea  Diarrhea, unspecified type    Overall well appearing. HTN. Noncompliant with medications. Will start on metoprolol and HCTZ. Will dc home. Needs a pcp for follow up. Now with nausea and diarrhea. No CP. Dc home with phenergan  I personally performed the services described in this documentation, which was scribed in  my presence. The recorded information has been reviewed and is accurate.       Azalia Bilis, MD 11/12/15 [id number]

## 2015-11-12 NOTE — Discharge Instructions (Signed)
Hypertension Hypertension, commonly called high blood pressure, is when the force of blood pumping through your arteries is too strong. Your arteries are the blood vessels that carry blood from your heart throughout your body. A blood pressure reading consists of a higher number over a lower number, such as 110/72. The higher number (systolic) is the pressure inside your arteries when your heart pumps. The lower number (diastolic) is the pressure inside your arteries when your heart relaxes. Ideally you want your blood pressure below 120/80. Hypertension forces your heart to work harder to pump blood. Your arteries may become narrow or stiff. Having untreated or uncontrolled hypertension can cause heart attack, stroke, kidney disease, and other problems. RISK FACTORS Some risk factors for high blood pressure are controllable. Others are not.  Risk factors you cannot control include:   Race. You may be at higher risk if you are African American.  Age. Risk increases with age.  Gender. Men are at higher risk than women before age 45 years. After age 65, women are at higher risk than men. Risk factors you can control include:  Not getting enough exercise or physical activity.  Being overweight.  Getting too much fat, sugar, calories, or salt in your diet.  Drinking too much alcohol. SIGNS AND SYMPTOMS Hypertension does not usually cause signs or symptoms. Extremely high blood pressure (hypertensive crisis) may cause headache, anxiety, shortness of breath, and nosebleed. DIAGNOSIS To check if you have hypertension, your health care provider will measure your blood pressure while you are seated, with your arm held at the level of your heart. It should be measured at least twice using the same arm. Certain conditions can cause a difference in blood pressure between your right and left arms. A blood pressure reading that is higher than normal on one occasion does not mean that you need treatment. If  it is not clear whether you have high blood pressure, you may be asked to return on a different day to have your blood pressure checked again. Or, you may be asked to monitor your blood pressure at home for 1 or more weeks. TREATMENT Treating high blood pressure includes making lifestyle changes and possibly taking medicine. Living a healthy lifestyle can help lower high blood pressure. You may need to change some of your habits. Lifestyle changes may include:  Following the DASH diet. This diet is high in fruits, vegetables, and whole grains. It is low in salt, red meat, and added sugars.  Keep your sodium intake below 2,300 mg per day.  Getting at least 30-45 minutes of aerobic exercise at least 4 times per week.  Losing weight if necessary.  Not smoking.  Limiting alcoholic beverages.  Learning ways to reduce stress. Your health care provider may prescribe medicine if lifestyle changes are not enough to get your blood pressure under control, and if one of the following is true:  You are 18-59 years of age and your systolic blood pressure is above 140.  You are 60 years of age or older, and your systolic blood pressure is above 150.  Your diastolic blood pressure is above 90.  You have diabetes, and your systolic blood pressure is over 140 or your diastolic blood pressure is over 90.  You have kidney disease and your blood pressure is above 140/90.  You have heart disease and your blood pressure is above 140/90. Your personal target blood pressure may vary depending on your medical conditions, your age, and other factors. HOME CARE INSTRUCTIONS    Have your blood pressure rechecked as directed by your health care provider.   Take medicines only as directed by your health care provider. Follow the directions carefully. Blood pressure medicines must be taken as prescribed. The medicine does not work as well when you skip doses. Skipping doses also puts you at risk for  problems.  Do not smoke.   Monitor your blood pressure at home as directed by your health care provider. SEEK MEDICAL CARE IF:   You think you are having a reaction to medicines taken.  You have recurrent headaches or feel dizzy.  You have swelling in your ankles.  You have trouble with your vision. SEEK IMMEDIATE MEDICAL CARE IF:  You develop a severe headache or confusion.  You have unusual weakness, numbness, or feel faint.  You have severe chest or abdominal pain.  You vomit repeatedly.  You have trouble breathing. MAKE SURE YOU:   Understand these instructions.  Will watch your condition.  Will get help right away if you are not doing well or get worse.   This information is not intended to replace advice given to you by your health care provider. Make sure you discuss any questions you have with your health care provider.   Document Released: 09/05/2005 Document Revised: 01/20/2015 Document Reviewed: 06/28/2013 Elsevier Interactive Patient Education 2016 Else[employer] Inc. Heart Disease Prevention Heart disease is a leading cause of death. There are many things you can do to help prevent heart disease. BE PHYSICALLY ACTIVE Physical activity is good for your heart. It helps control your blood pressure, cholesterol levels, and weight. Try to be physically active every day. Ask your health care provider what activities are best for you.  BE A HEALTHY WEIGHT Extra weight can strain your heart and affect your blood pressure and cholesterol levels. Lose weight with diet and exercise if recommended by your health care provider. EAT HEART-HEALTHY FOODS Follow a healthy eating plan as recommended by your health care provider or dietitian. Heart-healthy foods include:   High-fiber foods. These include oat bran, oatmeal, and whole-grain breads and cereals.  Fruits and vegetables. Avoid:  Alcohol.  Fried foods.  Foods high in saturated fat. These include meats, butter,  whole dairy products, shortening, and coconut or palm oil.  Salty foods. These include canned food, luncheon meat, salty snacks, and fast food. KEEP YOUR CHOLESTEROL LEVELS UNDER CONTROL Cholesterol is a substance that is used for many important functions. When your cholesterol levels are high, cholesterol can stick to the insides of your blood vessels, making them narrow or clog. This can lead to chest pain (angina) and a heart attack.  Keep your cholesterol levels under control as recommended by your health care provider. Have your cholesterol checked at least once a year. Target cholesterol levels (in mg/dL) for most people are:   Total cholesterol below 200.  LDL cholesterol below 100.  HDL cholesterol above 40 in men and above 50 in women.  Triglycerides below 150. KEEP YOUR BLOOD PRESSURE UNDER CONTROL Having high blood pressure (hypertension) puts you at risk for stroke and other forms of heart disease. Keep your blood pressure under control as recommended by your health care provider. Ask your health care provider if you need treatment to lower your blood pressure. If you are 1-68 years of age, have your blood pressure checked every 3-5 years. If you are 109 years of age or older, have your blood pressure checked every year. DO NOT USE TOBACCO PRODUCTS Tobacco smoke can damage your  heart and blood vessels. Do not use any tobacco products including cigarettes, chewing tobacco, or electronic cigarettes. If you need help quitting, ask your health care provider. TAKE MEDICINES AS DIRECTED Take medicines only as directed by your health care provider. Ask your health care provider whether you should take an aspirin every day. Taking aspirin can help reduce your risk of heart disease and stroke.  FOR MORE INFORMATION  To find out more about heart disease, visit the American Heart Association's website at www.americanheart.org   This information is not intended to replace advice given to you  by your health care provider. Make sure you discuss any questions you have with your health care provider.   Document Released: 04/19/2004 Document Revised: 09/26/2014 Document Reviewed: 10/30/2013 Elsevier Interactive Patient Education [employer]

## 2015-11-23 ENCOUNTER — Emergency Department (HOSPITAL_COMMUNITY)
Admission: EM | Admit: 2015-11-23 | Discharge: 2015-11-23 | Disposition: A | Discharge status: Home / Self Care | Payer: MEDICAID | Attending: Emergency Medicine | Admitting: Emergency Medicine

## 2015-11-23 ENCOUNTER — Emergency Department (HOSPITAL_COMMUNITY): Payer: MEDICAID

## 2015-11-23 ENCOUNTER — Encounter (HOSPITAL_COMMUNITY): Payer: Self-pay | Admitting: Emergency Medicine

## 2015-11-23 DIAGNOSIS — I110 Hypertensive heart disease with heart failure: Secondary | ICD-10-CM | POA: Insufficient documentation

## 2015-11-23 DIAGNOSIS — I5032 Chronic diastolic (congestive) heart failure: Secondary | ICD-10-CM | POA: Insufficient documentation

## 2015-11-23 DIAGNOSIS — Z79899 Other long term (current) drug therapy: Secondary | ICD-10-CM | POA: Insufficient documentation

## 2015-11-23 DIAGNOSIS — Z9889 Other specified postprocedural states: Secondary | ICD-10-CM | POA: Insufficient documentation

## 2015-11-23 DIAGNOSIS — M722 Plantar fascial fibromatosis: Secondary | ICD-10-CM | POA: Insufficient documentation

## 2015-11-23 DIAGNOSIS — Z880 Allergy status to penicillin: Secondary | ICD-10-CM | POA: Insufficient documentation

## 2015-11-23 DIAGNOSIS — I159 Secondary hypertension, unspecified: Secondary | ICD-10-CM | POA: Insufficient documentation

## 2015-11-23 DIAGNOSIS — I2510 Atherosclerotic heart disease of native coronary artery without angina pectoris: Secondary | ICD-10-CM | POA: Insufficient documentation

## 2015-11-23 DIAGNOSIS — Z7982 Long term (current) use of aspirin: Secondary | ICD-10-CM | POA: Insufficient documentation

## 2015-11-23 MED ORDER — HYDROCHLOROTHIAZIDE 25 MG PO TABS: 25.0000 mg | ORAL_TABLET | Freq: Every day | ORAL | Status: DC

## 2015-11-23 MED ORDER — METOPROLOL TARTRATE 25 MG PO TABS: 25.0000 mg | ORAL_TABLET | Freq: Two times a day (BID) | ORAL | Status: DC

## 2015-11-23 MED ORDER — NAPROXEN 500 MG PO TABS: 500.0000 mg | ORAL_TABLET | Freq: Two times a day (BID) | ORAL | Status: DC

## 2015-11-23 MED ORDER — TRAMADOL HCL 50 MG PO TABS: 50.0000 mg | ORAL_TABLET | Freq: Four times a day (QID) | ORAL | Status: DC | PRN

## 2015-11-23 MED ORDER — HYDROCHLOROTHIAZIDE 12.5 MG PO CAPS
25.0000 mg | ORAL_CAPSULE | Freq: Once | ORAL | Status: AC
  Administered 2015-11-23: 25 mg via ORAL
  Filled 2015-11-23: qty 2

## 2015-11-23 MED ORDER — METOPROLOL TARTRATE 25 MG PO TABS
25.0000 mg | ORAL_TABLET | Freq: Once | ORAL | Status: AC
  Administered 2015-11-23: 25 mg via ORAL
  Filled 2015-11-23: qty 1

## 2015-11-23 NOTE — ED Notes (Signed)
Pt c/o left foot pain(in heel) that has worsened. States it hurts to stand on it and walk. Pt is ambulatory to triage. Pt states her ankle was swollen but it went down.

## 2015-11-23 NOTE — Discharge Instructions (Signed)
Take blood pressure medications as prescribed. Take naprosyn for pain and inflammation. Take tramadol for severe pain only. Try ice massage. Get arch supports/orthotics for your shoes. Follow up with orthopedics or podiatry for recheck.   Plantar Fasciitis Plantar fasciitis is a painful foot condition that affects the heel. It occurs when the band of tissue that connects the toes to the heel bone (plantar fascia) becomes irritated. This can happen after exercising too much or doing other repetitive activities (overuse injury). The pain from plantar fasciitis can range from mild irritation to severe pain that makes it difficult for you to walk or move. The pain is usually worse in the morning or after you have been sitting or lying down for a while. CAUSES This condition may be caused by:  Standing for long periods of time.  Wearing shoes that do not fit.  Doing high-impact activities, including running, aerobics, and ballet.  Being overweight.  Having an abnormal way of walking (gait).  Having tight calf muscles.  Having high arches in your feet.  Starting a new athletic activity. SYMPTOMS The main symptom of this condition is heel pain. Other symptoms include:  Pain that gets worse after activity or exercise.  Pain that is worse in the morning or after resting.  Pain that goes away after you walk for a few minutes. DIAGNOSIS This condition may be diagnosed based on your signs and symptoms. Your health care provider will also do a physical exam to check for:  A tender area on the bottom of your foot.  A high arch in your foot.  Pain when you move your foot.  Difficulty moving your foot. You may also need to have imaging studies to confirm the diagnosis. These can include:  X-rays.  Ultrasound.  MRI. TREATMENT  Treatment for plantar fasciitis depends on the severity of the condition. Your treatment may include:  Rest, ice, and over-the-counter pain medicines to manage  your pain.  Exercises to stretch your calves and your plantar fascia.  A splint that holds your foot in a stretched, upward position while you sleep (night splint).  Physical therapy to relieve symptoms and prevent problems in the future.  Cortisone injections to relieve severe pain.  Extracorporeal shock wave therapy (ESWT) to stimulate damaged plantar fascia with electrical impulses. It is often used as a last resort before surgery.  Surgery, if other treatments have not worked after 12 months. HOME CARE INSTRUCTIONS  Take medicines only as directed by your health care provider.  Avoid activities that cause pain.  Roll the bottom of your foot over a bag of ice or a bottle of cold water. Do this for 20 minutes, 3-4 times a day.  Perform simple stretches as directed by your health care provider.  Try wearing athletic shoes with air-sole or gel-sole cushions or soft shoe inserts.  Wear a night splint while sleeping, if directed by your health care provider.  Keep all follow-up appointments with your health care provider. PREVENTION   Do not perform exercises or activities that cause heel pain.  Consider finding low-impact activities if you continue to have problems.  Lose weight if you need to. The best way to prevent plantar fasciitis is to avoid the activities that aggravate your plantar fascia. SEEK MEDICAL CARE IF:  Your symptoms do not go away after treatment with home care measures.  Your pain gets worse.  Your pain affects your ability to move or do your daily activities.   This information is not  intended to replace advice given to you by your health care provider. Make sure you discuss any questions you have with your health care provider.   Document Released: 05/31/2001 Document Revised: 05/27/2015 Document Reviewed: 07/16/2014 Elsevier Interactive Patient Education [employer]2016 Elsevier Inc.

## 2015-11-23 NOTE — ED Provider Notes (Signed)
CSN: [id number]     Arrival date & time 11/23/15  1152 History  By signing my name below, I, Theresa Soto, attest that this documentation has been prepared under the direction and in the presence of Myliah Medel, PA-C. Electronically Signed: Angelene Giovanni, ED Scribe. 11/23/2015. 1:55 PM.    Chief Complaint  Patient presents with  . Foot Pain    left   The history is provided by the patient. No language interpreter was used.   HPI Comments: Theresa Soto is a 40 y.o. femalewith a hx of hypertensive heart disease who presents to the Emergency Department complaining of gradually worsening intermittent shooting pain to the sole of her left foot onset several weeks ago. She states that the pain is worse when she bears weight and ambulates. She explains that she normally wears canvas shoes and flip flops. She denies any recent falls, injuries, and trauma. She denies any knee or ankle pain. No wounds or rash.   Pt reports that she has not been compliant HTN medication for a month because she has orange card is awaiting approval for a PCP. Pt was seen on 11/11/15 and given a prescription for a 30 day supply of her medications and she states that she filled the prescriptions and have been compliant with those medications.    Past Medical History  Diagnosis Date  . Hypertensive heart disease     a.  Echo 1/16:  severe LVH, EF 55-60%, severe LAE;  b.  Renal Art [place] 1/16:  no RAS  . CAD (coronary artery disease)     elevated TnI 1/16 in setting of HTN urgency (demand ischemia) >> LHC 1/16:  prox to mid LAD 40%, EF 55%  . Chronic diastolic CHF (congestive heart failure) [name]    Past Surgical History  Procedure Laterality Date  . None    . Left heart catheterization with coronary angiogram N/A 10/10/2014    Procedure: LEFT HEART CATHETERIZATION WITH CORONARY ANGIOGRAM;  Surgeon: Lesleigh Noe, MD;  Location: [facility] C[facility] LAB;  Service: Cardiovascular;  Laterality: N/A;   Family History   Problem Relation Age of Onset  . Hypertension Father   . Kidney disease Father   . Heart attack Father    Social History  Substance Use Topics  . Smoking status: Never Smoker   . Smokeless tobacco: None  . Alcohol Use: 0.0 oz/week    0 Standard drinks or equivalent per week     Comment: Occasional glass of wine   OB History    No data available     Review of Systems  Constitutional:       Hypertensive  Musculoskeletal: Positive for myalgias (sole of left foot) and arthralgias (left foot).  Skin: Negative for rash and wound.      Allergies  Codeine; Flexeril; and Penicillins  Home Medications   Prior to Admission medications   Medication Sig Start Date End Date Taking? Authorizing Provider  aspirin EC 81 MG EC tablet Take 1 tablet (81 mg total) by mouth daily. 10/16/14   Maryann Mikhail, DO  hydrochlorothiazide (HYDRODIURIL) 25 MG tablet Take 1 tablet (25 mg total) by mouth daily. 11/12/15   Azalia Bilis, MD  metoprolol (LOPRESSOR) 25 MG tablet Take 1 tablet (25 mg total) by mouth 2 (two) times daily. 11/12/15   Azalia Bilis, MD  oxyCODONE (ROXICODONE) 5 MG immediate release tablet Take 1 tablet (5 mg total) by mouth every 4 (four) hours as needed for severe pain. 09/02/15  Satira SarkNicole Elizabeth Nadeau, PA-C  promethazine (PHENERGAN) 25 MG tablet Take 1 tablet (25 mg total) by mouth every 6 (six) hours as needed for nausea or vomiting. 11/12/15   Azalia BilisKevin Campos, MD   BP 189/117 mmHg  Pulse 92  Temp(Src) 98.9 F (37.2 C) (Oral)  Resp 20  SpO2 100%  LMP 11/09/2015 Physical Exam  Constitutional: She is oriented to person, place, and time. She appears well-developed and well-nourished.  HENT:  Head: Normocephalic and atraumatic.  Cardiovascular: Normal rate, regular rhythm and normal heart sounds.   Pulmonary/Chest: Effort normal and breath sounds normal. No respiratory distress. She has no wheezes. She has no rales.  Abdominal: She exhibits no distension.  Musculoskeletal:   Normal appearing left foot. Tender to palpation over the heel and plantar fascia. Patient has pain with plantar flexion of the toes. No tenderness over the ankle or Achilles tendon. Normal knee.  Neurological: She is alert and oriented to person, place, and time.  Skin: Skin is warm and dry.  Psychiatric: She has a normal mood and affect.  Nursing note and vitals reviewed.   ED Course  Procedures (including critical care time) DIAGNOSTIC STUDIES: Oxygen Saturation is 99% on RA, normal by my interpretation.    COORDINATION OF CARE: 1:50 PM- Pt advised of plan for treatment and pt agrees. Pt will receive x-ray for further evaluation. Advised to use arch support in her shoes, roll foot over frozen water bottle, and take anti-inflammatory for pain. Also advised pt to always be compliant with her HTN medication, she will receive a refill of her HTN medications.   Imaging Review Dg Foot Complete Left  11/23/2015  CLINICAL DATA:  Patient with plantar surface pain along the heel. No known injury. Initial encounter. EXAM: LEFT FOOT - COMPLETE 3+ VIEW COMPARISON:  None. FINDINGS: Normal anatomic alignment. No evidence for acute fracture or dislocation. Posterior calcaneal spurring. Mild midfoot degenerative changes. Regional soft tissues unremarkable. IMPRESSION: No acute osseous abnormality. Electronically Signed   By: Annia Beltrew  Davis M.D.   On: 11/23/2015 14:30     [name], PA-C has personally reviewed and evaluated these images as part of her medical decision-making.  MDM   Final diagnoses:  Plantar fasciitis of left foot  Secondary hypertension, unspecified    patient's exam is consistent with plantar fasciitis. Discussed getting orthotic inserts, ice massage, NSAIDs. Follow-up with orthopedics or podiatry. Patient's blood pressure is very high and emergency department, 191/122. She is asymptomatic for this blood pressure. She was just seen for the same 2 weeks ago and was prescribed  medications which she states she took however then she states that she has not had blood pressure medicines for month. I suspect patient did not fill her medications and taking them. I have given her a dose of her regular medications here. I will give her another prescription so that she has it. I discussed with her at length how important it is to keep her blood pressure down. She voiced understanding. She will follow-up with the primary care doctor.  Filed Vitals:   11/23/15 1221 11/23/15 1340 11/23/15 1410 11/23/15 1448  BP: 191/122 189/117 189/117 188/118  Pulse: 89 92 92 71  Temp: 98.9 F (37.2 C)     TempSrc: Oral     Resp: 20 20    SpO2: 99% 100%  100%     Jaynie Crumbleatyana Karrigan Messamore, PA-C 11/23/15 1522  Derwood KaplanAnkit Nanavati, MD 11/24/15 [phone]

## 2015-11-23 NOTE — ED Notes (Signed)
Patient transported to X-ray 

## 2016-01-08 ENCOUNTER — Ambulatory Visit: Payer: Self-pay | Admitting: Family Medicine

## 2016-10-24 ENCOUNTER — Emergency Department (HOSPITAL_COMMUNITY): Payer: Self-pay

## 2016-10-24 ENCOUNTER — Encounter (HOSPITAL_COMMUNITY): Payer: Self-pay

## 2016-10-24 ENCOUNTER — Emergency Department (HOSPITAL_COMMUNITY)
Admission: EM | Admit: 2016-10-24 | Discharge: 2016-10-25 | Disposition: A | Discharge status: Home / Self Care | Payer: Self-pay | Attending: Emergency Medicine | Admitting: Emergency Medicine

## 2016-10-24 DIAGNOSIS — N289 Disorder of kidney and ureter, unspecified: Secondary | ICD-10-CM | POA: Insufficient documentation

## 2016-10-24 DIAGNOSIS — I110 Hypertensive heart disease with heart failure: Secondary | ICD-10-CM | POA: Insufficient documentation

## 2016-10-24 DIAGNOSIS — I2510 Atherosclerotic heart disease of native coronary artery without angina pectoris: Secondary | ICD-10-CM | POA: Insufficient documentation

## 2016-10-24 DIAGNOSIS — R51 Headache: Secondary | ICD-10-CM | POA: Insufficient documentation

## 2016-10-24 DIAGNOSIS — G44209 Tension-type headache, unspecified, not intractable: Secondary | ICD-10-CM

## 2016-10-24 DIAGNOSIS — E876 Hypokalemia: Secondary | ICD-10-CM | POA: Insufficient documentation

## 2016-10-24 DIAGNOSIS — I5032 Chronic diastolic (congestive) heart failure: Secondary | ICD-10-CM | POA: Insufficient documentation

## 2016-10-24 DIAGNOSIS — Z7982 Long term (current) use of aspirin: Secondary | ICD-10-CM | POA: Insufficient documentation

## 2016-10-24 DIAGNOSIS — I10 Essential (primary) hypertension: Secondary | ICD-10-CM

## 2016-10-24 LAB — BASIC METABOLIC PANEL
ANION GAP: 10 (ref 5–15)
BUN: 14 mg/dL (ref 6–20)
CO2: 26 mmol/L (ref 22–32)
Calcium: 9 mg/dL (ref 8.9–10.3)
Chloride: 101 mmol/L (ref 101–111)
Creatinine, Ser: 1.36 mg/dL — ABNORMAL HIGH (ref 0.44–1.00)
GFR calc Af Amer: 56 mL/min — ABNORMAL LOW (ref >60)
GFR, EST NON AFRICAN AMERICAN: 48 mL/min — AB (ref >60)
GLUCOSE: 141 mg/dL — AB (ref 65–99)
POTASSIUM: 3.3 mmol/L — AB (ref 3.5–5.1)
Sodium: 137 mmol/L (ref 135–145)

## 2016-10-24 LAB — CBC
HCT: 39 % (ref 36.0–46.0)
HEMOGLOBIN: 12.8 g/dL (ref 12.0–15.0)
MCH: 26.6 pg (ref 26.0–34.0)
MCHC: 32.8 g/dL (ref 30.0–36.0)
MCV: 80.9 fL (ref 78.0–100.0)
Platelets: 348 10*3/uL (ref 150–400)
RBC: 4.82 MIL/uL (ref 3.87–5.11)
RDW: 14.5 % (ref 11.5–15.5)
WBC: 6.5 10*3/uL (ref 4.0–10.5)

## 2016-10-24 LAB — I-STAT TROPONIN, ED: Troponin i, poc: 0 ng/mL (ref 0.00–0.08)

## 2016-10-24 MED ORDER — ONDANSETRON 4 MG PO TBDP
4.0000 mg | ORAL_TABLET | Freq: Once | ORAL | Status: AC | PRN
  Administered 2016-10-25: 4 mg via ORAL
  Filled 2016-10-24: qty 1

## 2016-10-24 MED ORDER — CLONIDINE HCL 0.1 MG PO TABS
0.2000 mg | ORAL_TABLET | Freq: Once | ORAL | Status: AC
  Administered 2016-10-25: 0.2 mg via ORAL
  Filled 2016-10-24: qty 2

## 2016-10-24 NOTE — ED Triage Notes (Signed)
Pt states that starting 2 weeks ago, she began having chest pain, SOB when lying down, difficulty waking up and intermittent headaches. Today she endorses weakness, SOB, and a "light" headache. A&Ox4. Hx HTN, no longer on medication due to lack of access.

## 2016-10-24 NOTE — ED Provider Notes (Signed)
[facility]-EMERGENCY DEPT Provider Note   CSN: [id number] Arrival date & time: 10/24/16  1652  By signing my name below, I, Theresa Soto, attest that this documentation has been prepared under the direction and in the presence of Theresa Booze, MD. Electronically Signed: Elder Soto, Scribe. 10/24/16. 11:47 PM.   History   Chief Complaint Chief Complaint  Patient presents with  . Chest Pain  . Shortness of Breath    HPI Theresa Soto is a 41 y.o. female with history of CAD, CHF, and hypertension who presents to the ED for evaluation of multiple complaints in the setting of prescription loss. In the last 2 weeks she has experienced fluctuating severities of nausea, R sided chest "heaviness" with dyspnea, lightheadedness, frontal headaches, and epistaxis that at times "wakes her in the middle of the night". The patient has recently stopped taking her anti-hypertensive medications due to loss of coverage. She did obtain new insurance coverage "a month ago" however that coverage would not permit her to see a physician for 2 months. She did schedule that appointment. Since that time she has been checking her blood pressure occasionally at [facility] and at those times her pressure was "high".   The history is provided by the patient. No language interpreter was used.    Past Medical History:  Diagnosis Date  . CAD (coronary artery disease)    elevated TnI 1/16 in setting of HTN urgency (demand ischemia) >> LHC 1/16:  prox to mid LAD 40%, EF 55%  . Chronic diastolic CHF (congestive heart failure) (HCC)   . Hypertensive heart disease    a.  Echo 1/16:  severe LVH, EF 55-60%, severe LAE;  b.  Renal Art [place] 1/16:  no RAS    Patient Active Problem List   Diagnosis Date Noted  . Hypertensive heart disease   . CAD (coronary artery disease)   . Chronic diastolic CHF (congestive heart failure) (HCC)   . Cephalalgia   . Pain in the chest   . AKI (acute kidney injury) (HCC) 10/10/2014  .  Elevated troponin I level 10/10/2014  . Other chest pain 10/10/2014    Past Surgical History:  Procedure Laterality Date  . LEFT HEART CATHETERIZATION WITH CORONARY ANGIOGRAM N/A 10/10/2014   Procedure: LEFT HEART CATHETERIZATION WITH CORONARY ANGIOGRAM;  Surgeon: Lesleigh Noe, MD;  Location: [facility] C[facility] LAB;  Service: Cardiovascular;  Laterality: N/A;  . None      OB History    No data available       Home Medications    Prior to Admission medications   Medication Sig Start Date End Date Taking? Authorizing Provider  aspirin EC 81 MG EC tablet Take 1 tablet (81 mg total) by mouth daily. 10/16/14   Maryann Mikhail, DO  hydrochlorothiazide (HYDRODIURIL) 25 MG tablet Take 1 tablet (25 mg total) by mouth daily. 11/12/15   Azalia Bilis, MD  hydrochlorothiazide (HYDRODIURIL) 25 MG tablet Take 1 tablet (25 mg total) by mouth daily. 11/23/15   Tatyana Kirichenko, PA-C  metoprolol (LOPRESSOR) 25 MG tablet Take 1 tablet (25 mg total) by mouth 2 (two) times daily. 11/12/15   Azalia Bilis, MD  metoprolol (LOPRESSOR) 25 MG tablet Take 1 tablet (25 mg total) by mouth 2 (two) times daily. 11/23/15   Tatyana Kirichenko, PA-C  naproxen (NAPROSYN) 500 MG tablet Take 1 tablet (500 mg total) by mouth 2 (two) times daily. 11/23/15   Tatyana Kirichenko, PA-C  oxyCODONE (ROXICODONE) 5 MG immediate release tablet Take 1 tablet (5  mg total) by mouth every 4 (four) hours as needed for severe pain. 09/02/15   Barrett Henle, PA-C  promethazine (PHENERGAN) 25 MG tablet Take 1 tablet (25 mg total) by mouth every 6 (six) hours as needed for nausea or vomiting. 11/12/15   Azalia Bilis, MD  traMADol (ULTRAM) 50 MG tablet Take 1 tablet (50 mg total) by mouth every 6 (six) hours as needed. 11/23/15   Jaynie Crumble, PA-C    Family History Family History  Problem Relation Age of Onset  . Hypertension Father   . Kidney disease Father   . Heart attack Father     Social History Social History  Substance Use  Topics  . Smoking status: Never Smoker  . Smokeless tobacco: Not on file  . Alcohol use 0.0 oz/week     Comment: Occasional glass of wine     Allergies   Codeine; Flexeril [cyclobenzaprine]; and Penicillins   Review of Systems Review of Systems  HENT: Positive for nosebleeds.   Respiratory: Positive for shortness of breath.   Cardiovascular: Positive for chest pain.  Gastrointestinal: Positive for nausea.  Neurological: Positive for light-headedness and headaches.  All other systems reviewed and are negative.    Physical Exam Updated Vital Signs BP (!) 194/112 (BP Location: Right Arm)   Pulse 77   Temp 98 F (36.7 C) (Oral)   Resp 18   Ht 5\' 7"  (1.702 m)   Wt 250 lb (113.4 kg)   SpO2 100%   BMI 39.16 kg/m   Physical Exam  Constitutional: She is oriented to person, place, and time. She appears well-developed and well-nourished.  HENT:  Head: Normocephalic and atraumatic.  Tenderness over the paracervical muscles and temporalis insertion points bilaterally.   Eyes: EOM are normal. Pupils are equal, round, and reactive to light.  Fundi show no hemorrhage or exudate.   Neck: Normal range of motion. Neck supple. No JVD present.  Cardiovascular: Normal rate, regular rhythm and normal heart sounds.   No murmur heard. Pulmonary/Chest: Effort normal and breath sounds normal. She has no wheezes. She has no rales. She exhibits no tenderness.  Abdominal: Soft. Bowel sounds are normal. She exhibits no distension and no mass. There is no tenderness.  Musculoskeletal: Normal range of motion. She exhibits no edema.  Lymphadenopathy:    She has no cervical adenopathy.  Neurological: She is alert and oriented to person, place, and time. No cranial nerve deficit. She exhibits normal muscle tone. Coordination normal.  Skin: Skin is warm and dry. No rash noted.  Psychiatric: She has a normal mood and affect. Her behavior is normal. Judgment and thought content normal.  Nursing note  and vitals reviewed.    ED Treatments / Results  DIAGNOSTIC STUDIES: Oxygen Saturation is 100 percent on room air which is normal by my interpretation.    COORDINATION OF CARE: 11:46 PM Discussed treatment plan with pt at bedside and pt agreed to plan.  Labs (all labs ordered are listed, but only abnormal results are displayed) Labs Reviewed  BASIC METABOLIC PANEL - Abnormal; Notable for the following:       Result Value   Potassium 3.3 (*)    Glucose, Bld 141 (*)    Creatinine, Ser 1.36 (*)    GFR calc non Af Amer 48 (*)    GFR calc Af Amer 56 (*)    All other components within normal limits  URINALYSIS, ROUTINE W REFLEX MICROSCOPIC - Abnormal; Notable for the following:    APPearance  CLOUDY (*)    Protein, ur 30 (*)    Leukocytes, UA TRACE (*)    Bacteria, UA MANY (*)    Squamous Epithelial / LPF 6-30 (*)    All other components within normal limits  CBC  I-STAT TROPOININ, ED  I-STAT BETA HCG BLOOD, ED ([facility], [facility], [facility] ONLY)    EKG  EKG Interpretation  Date/Time:  Monday October 24 2016 17:01:53 EST Ventricular Rate:  99 PR Interval:    QRS Duration: 98 QT Interval:  351 QTC Calculation: 451 R Axis:   66 Text Interpretation:  Sinus rhythm Prolonged PR interval Borderline T abnormalities, inferior leads When compared with ECG of 10/09/2014, T wave abnormality less evident in lateral leads Confirmed by [facility]  MD, Coreon Simkins ([id number]) on 10/24/2016 11:35:46 PM       Radiology Dg Chest 2 View  Result Date: 10/24/2016 CLINICAL DATA:  Lambert Mody right chest pain. Chest heaviness. Shortness of breath. EXAM: CHEST  2 VIEW COMPARISON:  10/08/2014 FINDINGS: Right chest pain, mild cardiomegaly with cardiothoracic index 55%. No edema. The lungs appear clear.  No pleural effusion. No significant bony abnormality observed. IMPRESSION: 1. Mild enlargement of the cardiopericardial silhouette, without edema. Electronically Signed   By: Gaylyn Rong M.D.   On: 10/24/2016 17:46     Procedures Procedures (including critical care time)  Medications Ordered in ED Medications  potassium chloride SA (K-DUR,KLOR-CON) CR tablet 40 mEq (not administered)  ondansetron (ZOFRAN-ODT) disintegrating tablet 4 mg (4 mg Oral Given 10/25/16 0009)  cloNIDine (CATAPRES) tablet 0.2 mg (0.2 mg Oral Given 10/25/16 0009)  sodium chloride 0.9 % bolus 1,000 mL (1,000 mLs Intravenous New Bag/Given 10/25/16 0127)  metoCLOPramide (REGLAN) injection 10 mg (10 mg Intravenous Given 10/25/16 0126)  diphenhydrAMINE (BENADRYL) injection 25 mg (25 mg Intravenous Given 10/25/16 0126)     Initial Impression / Assessment and Plan / ED Course  I have reviewed the triage vital signs and the nursing notes.  Pertinent labs & imaging results that were available during my care of the patient were reviewed by me and considered in my medical decision making (see chart for details).  Headache in the setting of severe hypertension. Old records are reviewed, and she has had similar blood pressure readings in the past. Multiple other complaints including headache and chest pain. She had a cardiac catheterization 2 years ago showing single-vessel disease with 40% LAD lesion. This is not likely to have progressed to a point that is clinically significant now. She is given a dose of clonidine and blood pressure has come down significantly. There is modest improvement of the headache but she still is not feeling well. She is given a headache cocktail of normal saline, metoclopramide, diphenhydramine. Following this, headache is completely gone and she feels much better. Blood pressures come down to normal range. She is given prescriptions for her blood pressure medications and is advised to make sure that she does not run out. She is noted to have mild hypokalemia. One of her medications as a diuretic, so she will need to be on potassium supplementation as well. She is also given prescription for metoclopramide. Urinalysis bacteria  but she has no urinary symptoms so she is not treated for UTI. This is probably because a contaminated specimen as evidenced by 6-30 epithelial cells. Mild proteinuria is concerning for renal effects of hypertension. She has mild renal insufficiency which is not changed from baseline. Patient is advised of these findings. [facility]. Advised return to the ED or to urgent care to get prescription refilled if she is running out before she can be seen [facility].  Final Clinical Impressions(s) / ED Diagnoses   Final diagnoses:  Essential hypertension  Muscle contraction headache  Renal insufficiency  Hypokalemia    New Prescriptions New Prescriptions   METOCLOPRAMIDE (REGLAN) 10 MG TABLET    Take 1 tablet (10 mg total) by mouth every 6 (six) hours.   POTASSIUM CHLORIDE SA (K-DUR,KLOR-CON) 20 MEQ TABLET    Take 1 tablet (20 mEq total) by mouth daily.   I personally performed the services described in this documentation, which was scribed in my presence. The recorded information has been reviewed and is accurate.      Joshoa Shawler,Theresa Booze MD 10/25/16 [phone]

## 2016-10-25 LAB — URINALYSIS, ROUTINE W REFLEX MICROSCOPIC
Bilirubin Urine: NEGATIVE
Glucose, UA: NEGATIVE mg/dL
HGB URINE DIPSTICK: NEGATIVE
Ketones, ur: NEGATIVE mg/dL
NITRITE: NEGATIVE
PROTEIN: 30 mg/dL — AB
SPECIFIC GRAVITY, URINE: 1.016 (ref 1.005–1.030)
pH: 6 (ref 5.0–8.0)

## 2016-10-25 MED ORDER — METOPROLOL TARTRATE 25 MG PO TABS: 25.0000 mg | ORAL_TABLET | Freq: Two times a day (BID) | ORAL | 1 refills | Status: DC

## 2016-10-25 MED ORDER — HYDROCHLOROTHIAZIDE 25 MG PO TABS: 25.0000 mg | ORAL_TABLET | Freq: Every day | ORAL | 1 refills | Status: DC

## 2016-10-25 MED ORDER — POTASSIUM CHLORIDE CRYS ER 20 MEQ PO TBCR
40.0000 meq | EXTENDED_RELEASE_TABLET | Freq: Once | ORAL | Status: AC
  Administered 2016-10-25: 40 meq via ORAL
  Filled 2016-10-25: qty 2

## 2016-10-25 MED ORDER — DIPHENHYDRAMINE HCL 50 MG/ML IJ SOLN
25.0000 mg | Freq: Once | INTRAMUSCULAR | Status: AC
  Administered 2016-10-25: 25 mg via INTRAVENOUS
  Filled 2016-10-25: qty 1

## 2016-10-25 MED ORDER — POTASSIUM CHLORIDE CRYS ER 20 MEQ PO TBCR: 20.0000 meq | EXTENDED_RELEASE_TABLET | Freq: Every day | ORAL | 0 refills | Status: DC

## 2016-10-25 MED ORDER — METOCLOPRAMIDE HCL 10 MG PO TABS: 10.0000 mg | ORAL_TABLET | Freq: Four times a day (QID) | ORAL | 0 refills | Status: DC

## 2016-10-25 MED ORDER — SODIUM CHLORIDE 0.9 % IV BOLUS (SEPSIS)
1000.0000 mL | Freq: Once | INTRAVENOUS | Status: AC
Start: 2016-10-25 — End: 2016-10-25
  Administered 2016-10-25: 1000 mL via INTRAVENOUS

## 2016-10-25 MED ORDER — METOCLOPRAMIDE HCL 5 MG/ML IJ SOLN
10.0000 mg | Freq: Once | INTRAMUSCULAR | Status: AC
  Administered 2016-10-25: 10 mg via INTRAVENOUS
  Filled 2016-10-25: qty 2

## 2016-10-25 NOTE — Discharge Instructions (Signed)
Monitor your blood pressure - check it at least twice a week, and keep a record of it. Do not allow yourself to run out of your blood pressure medication. If you are running out [facility], then come tourgent care or the ED to get prescriptions refilled.

## 2016-11-03 IMAGING — CR DG FOOT COMPLETE 3+V*L*
3 series · 3 of 3 positions shown · non-contrast
Comparison: None.

CLINICAL DATA: Patient with plantar surface pain along the heel. No
known injury. Initial encounter.

EXAM:
LEFT FOOT - COMPLETE 3+ VIEW

[x foot ap left]
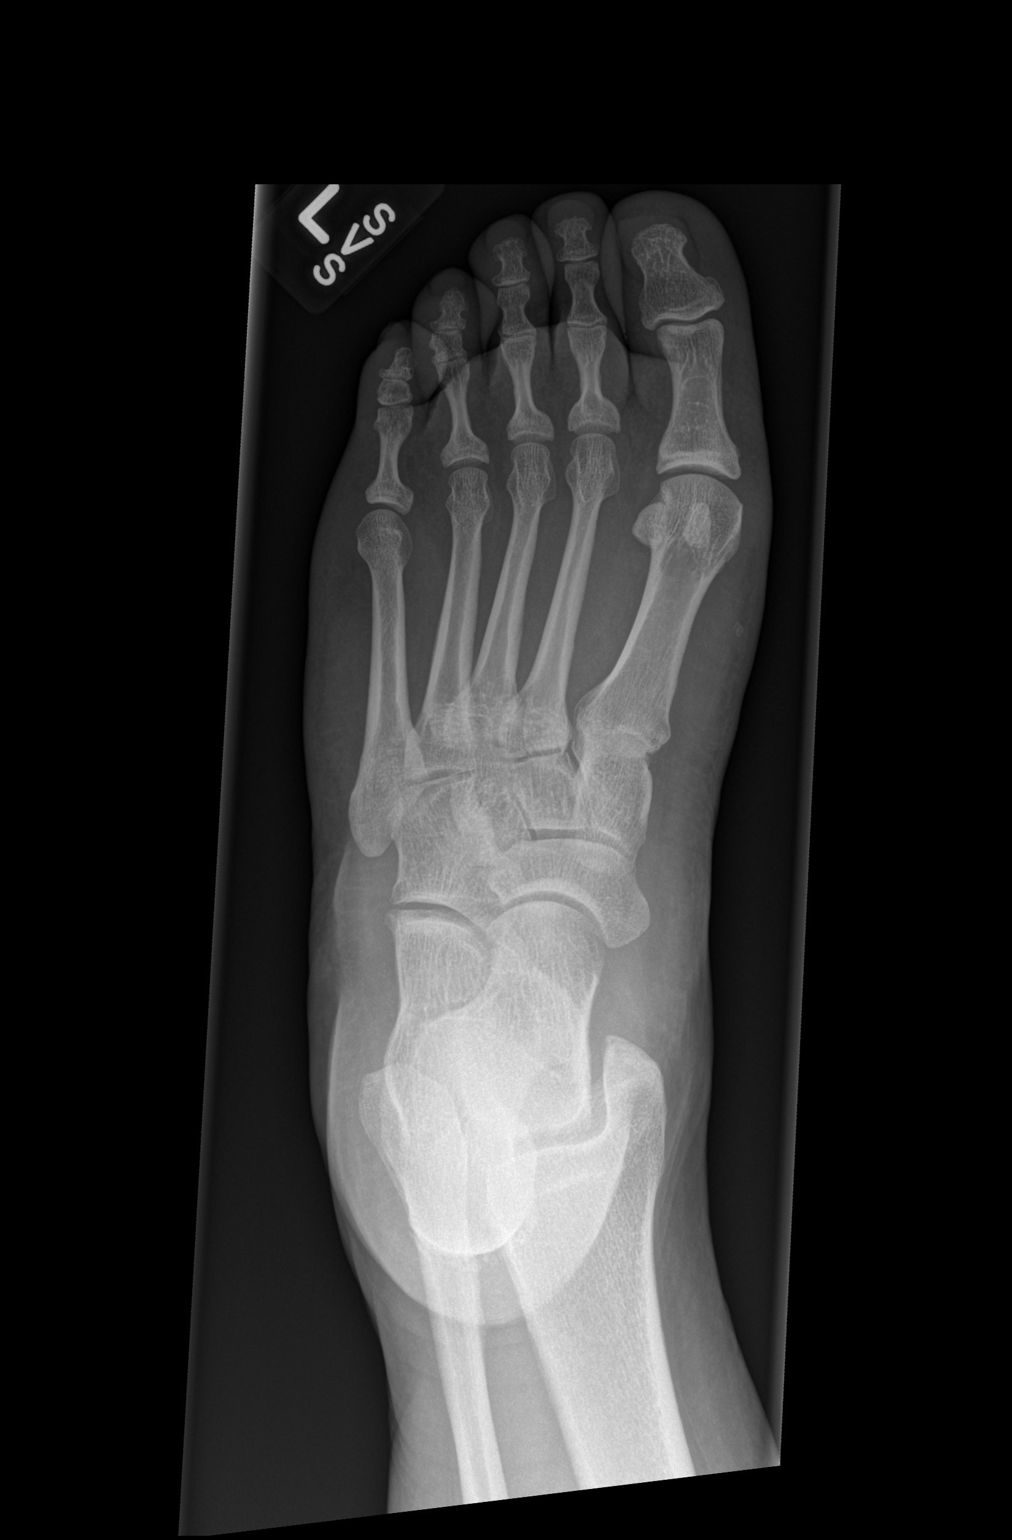

[x foot obl left]
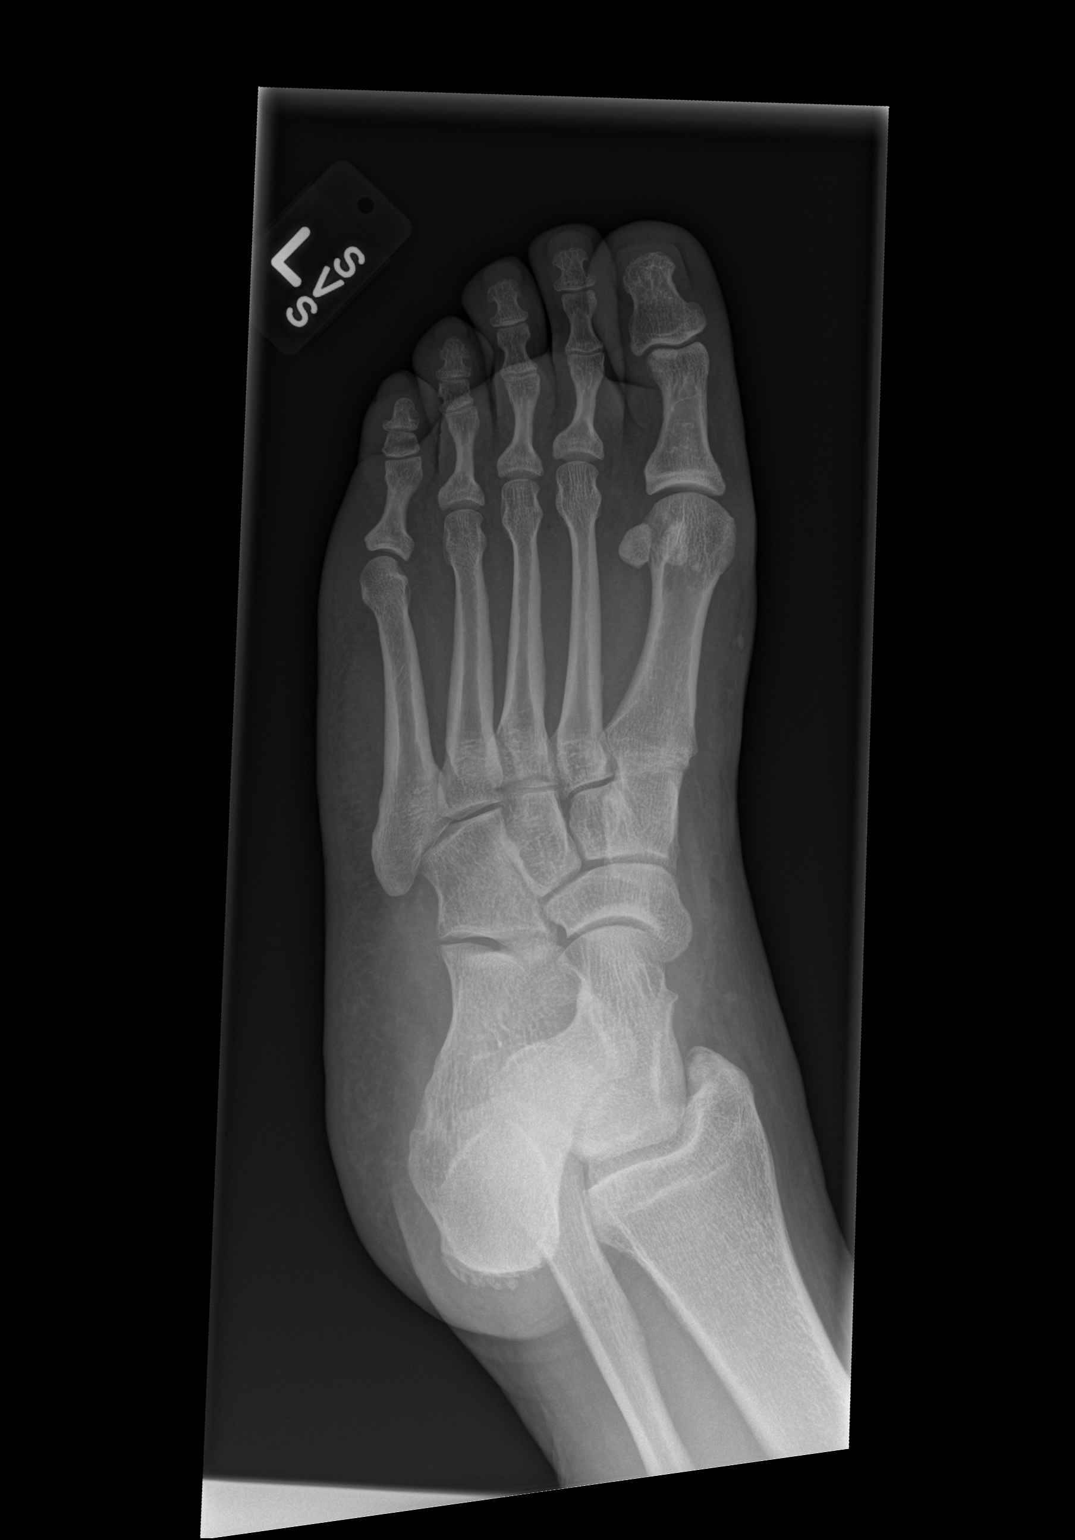

[x foot lat left]
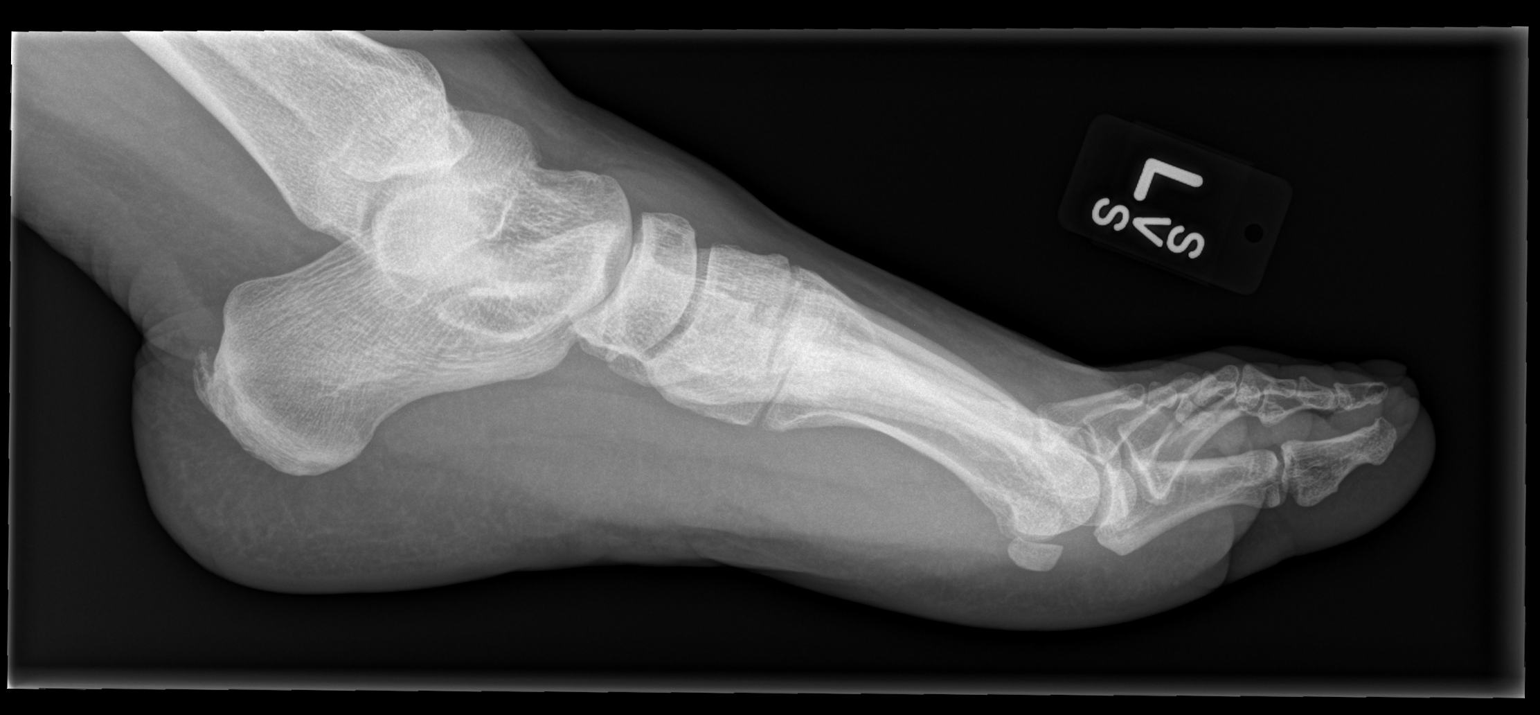

[3 of 3 positions shown; findings below may reference images not displayed]

FINDINGS: Normal anatomic alignment. No evidence for acute fracture or
dislocation. Posterior calcaneal spurring. Mild midfoot degenerative
changes. Regional soft tissues unremarkable.
IMPRESSION: No acute osseous abnormality.

## 2016-12-15 ENCOUNTER — Encounter (HOSPITAL_COMMUNITY): Payer: Self-pay | Admitting: Emergency Medicine

## 2016-12-15 DIAGNOSIS — R748 Abnormal levels of other serum enzymes: Secondary | ICD-10-CM | POA: Insufficient documentation

## 2016-12-15 DIAGNOSIS — I2510 Atherosclerotic heart disease of native coronary artery without angina pectoris: Secondary | ICD-10-CM | POA: Insufficient documentation

## 2016-12-15 DIAGNOSIS — E876 Hypokalemia: Secondary | ICD-10-CM | POA: Insufficient documentation

## 2016-12-15 DIAGNOSIS — I5032 Chronic diastolic (congestive) heart failure: Secondary | ICD-10-CM | POA: Insufficient documentation

## 2016-12-15 DIAGNOSIS — E278 Other specified disorders of adrenal gland: Secondary | ICD-10-CM | POA: Insufficient documentation

## 2016-12-15 DIAGNOSIS — I110 Hypertensive heart disease with heart failure: Secondary | ICD-10-CM | POA: Insufficient documentation

## 2016-12-15 LAB — URINALYSIS, ROUTINE W REFLEX MICROSCOPIC
Bilirubin Urine: NEGATIVE
GLUCOSE, UA: NEGATIVE mg/dL
HGB URINE DIPSTICK: NEGATIVE
Ketones, ur: NEGATIVE mg/dL
NITRITE: NEGATIVE
PH: 5 (ref 5.0–8.0)
PROTEIN: NEGATIVE mg/dL
SPECIFIC GRAVITY, URINE: 1.012 (ref 1.005–1.030)

## 2016-12-15 LAB — CBC
HEMATOCRIT: 42.1 % (ref 36.0–46.0)
Hemoglobin: 14 g/dL (ref 12.0–15.0)
MCH: 26.7 pg (ref 26.0–34.0)
MCHC: 33.3 g/dL (ref 30.0–36.0)
MCV: 80.3 fL (ref 78.0–100.0)
PLATELETS: 372 10*3/uL (ref 150–400)
RBC: 5.24 MIL/uL — ABNORMAL HIGH (ref 3.87–5.11)
RDW: 15 % (ref 11.5–15.5)
WBC: 7.5 10*3/uL (ref 4.0–10.5)

## 2016-12-15 LAB — POC URINE PREG, ED: Preg Test, Ur: NEGATIVE

## 2016-12-15 LAB — COMPREHENSIVE METABOLIC PANEL
ALBUMIN: 4.3 g/dL (ref 3.5–5.0)
ALK PHOS: 62 U/L (ref 38–126)
ALT: 27 U/L (ref 14–54)
AST: 33 U/L (ref 15–41)
Anion gap: 11 (ref 5–15)
BILIRUBIN TOTAL: 0.2 mg/dL — AB (ref 0.3–1.2)
BUN: 21 mg/dL — AB (ref 6–20)
CALCIUM: 9.9 mg/dL (ref 8.9–10.3)
CO2: 29 mmol/L (ref 22–32)
Chloride: 95 mmol/L — ABNORMAL LOW (ref 101–111)
Creatinine, Ser: 1.68 mg/dL — ABNORMAL HIGH (ref 0.44–1.00)
GFR calc Af Amer: 43 mL/min — ABNORMAL LOW (ref >60)
GFR, EST NON AFRICAN AMERICAN: 37 mL/min — AB (ref >60)
GLUCOSE: 123 mg/dL — AB (ref 65–99)
POTASSIUM: 3.3 mmol/L — AB (ref 3.5–5.1)
Sodium: 135 mmol/L (ref 135–145)
TOTAL PROTEIN: 8.1 g/dL (ref 6.5–8.1)

## 2016-12-15 LAB — LIPASE, BLOOD: Lipase: 31 U/L (ref 11–51)

## 2016-12-15 NOTE — ED Triage Notes (Signed)
Pt reports back pain and abdominal pain that she attributes to a bladder infection/UTI. States increased urinary frequency with oliguria. States no dysuria, foul odor. States ongoing since last week Thursday. Worse with changes in positioning. LMP 11/30/16. Hypertensive in triage

## 2016-12-16 ENCOUNTER — Emergency Department (HOSPITAL_COMMUNITY)
Admission: EM | Admit: 2016-12-16 | Discharge: 2016-12-16 | Disposition: A | Discharge status: Home / Self Care | Payer: Self-pay | Attending: Emergency Medicine | Admitting: Emergency Medicine

## 2016-12-16 ENCOUNTER — Emergency Department (HOSPITAL_COMMUNITY): Payer: Self-pay

## 2016-12-16 DIAGNOSIS — R1030 Lower abdominal pain, unspecified: Secondary | ICD-10-CM

## 2016-12-16 DIAGNOSIS — R109 Unspecified abdominal pain: Secondary | ICD-10-CM

## 2016-12-16 DIAGNOSIS — E278 Other specified disorders of adrenal gland: Secondary | ICD-10-CM

## 2016-12-16 DIAGNOSIS — R7989 Other specified abnormal findings of blood chemistry: Secondary | ICD-10-CM

## 2016-12-16 DIAGNOSIS — E876 Hypokalemia: Secondary | ICD-10-CM

## 2016-12-16 LAB — WET PREP, GENITAL
Clue Cells Wet Prep HPF POC: NONE SEEN
Sperm: NONE SEEN
Trich, Wet Prep: NONE SEEN
YEAST WET PREP: NONE SEEN

## 2016-12-16 LAB — GC/CHLAMYDIA PROBE AMP (~~LOC~~) NOT AT ARMC
Chlamydia: NEGATIVE
NEISSERIA GONORRHEA: NEGATIVE

## 2016-12-16 MED ORDER — FENTANYL CITRATE (PF) 100 MCG/2ML IJ SOLN
50.0000 ug | Freq: Once | INTRAMUSCULAR | Status: AC
  Administered 2016-12-16: 50 ug via INTRAVENOUS
  Filled 2016-12-16: qty 2

## 2016-12-16 MED ORDER — NITROFURANTOIN MONOHYD MACRO 100 MG PO CAPS: 100.0000 mg | ORAL_CAPSULE | Freq: Two times a day (BID) | ORAL | 0 refills | Status: DC

## 2016-12-16 MED ORDER — POTASSIUM CHLORIDE CRYS ER 20 MEQ PO TBCR
40.0000 meq | EXTENDED_RELEASE_TABLET | Freq: Once | ORAL | Status: AC
  Administered 2016-12-16: 40 meq via ORAL
  Filled 2016-12-16: qty 2

## 2016-12-16 MED ORDER — HYDROCODONE-ACETAMINOPHEN 5-325 MG PO TABS: 1.0000 | ORAL_TABLET | Freq: Four times a day (QID) | ORAL | 0 refills | Status: DC | PRN

## 2016-12-16 MED ORDER — ONDANSETRON HCL 4 MG/2ML IJ SOLN
4.0000 mg | Freq: Once | INTRAMUSCULAR | Status: AC
  Administered 2016-12-16: 4 mg via INTRAVENOUS
  Filled 2016-12-16: qty 2

## 2016-12-16 MED ORDER — SODIUM CHLORIDE 0.9 % IV BOLUS (SEPSIS)
500.0000 mL | Freq: Once | INTRAVENOUS | Status: AC
  Administered 2016-12-16: 500 mL via INTRAVENOUS

## 2016-12-16 NOTE — ED Notes (Signed)
Patient transported to CT 

## 2016-12-16 NOTE — ED Provider Notes (Signed)
[facility]-EMERGENCY DEPT Provider Note   CSN: [id number] Arrival date & time: 12/15/16  2005     History   Chief Complaint Chief Complaint  Patient presents with  . Abdominal Pain    possible UTI    HPI Theresa Soto is a 41 y.o. female with a hx of HTN, CHF, CAD presents to the Emergency Department complaining of gradual, persistent, progressively worsening lower abd pain onset 7 days ago. Associated symptoms include dysuria, urinary urgency, urinary frequency.  No hx of kidney stone.  Nothing makes it better and movement makes the abd pain worse.  Pt denies Fever, chills, headache, neck pain, chest pain, vaginal discharge, review: Weakness, dizziness, syncope. Patient denies recent antibiotic usage, recent travel. No known history of abdominal surgeries. She reports she's never had pain like this before..     The history is provided by the patient and medical records. No language interpreter was used.    Past Medical History:  Diagnosis Date  . CAD (coronary artery disease)    elevated TnI 1/16 in setting of HTN urgency (demand ischemia) >> LHC 1/16:  prox to mid LAD 40%, EF 55%  . Chronic diastolic CHF (congestive heart failure) (HCC)   . Hypertensive heart disease    a.  Echo 1/16:  severe LVH, EF 55-60%, severe LAE;  b.  Renal Art [place] 1/16:  no RAS    Patient Active Problem List   Diagnosis Date Noted  . Hypertensive heart disease   . CAD (coronary artery disease)   . Chronic diastolic CHF (congestive heart failure) (HCC)   . Cephalalgia   . Pain in the chest   . AKI (acute kidney injury) (HCC) 10/10/2014  . Elevated troponin I level 10/10/2014  . Other chest pain 10/10/2014    Past Surgical History:  Procedure Laterality Date  . LEFT HEART CATHETERIZATION WITH CORONARY ANGIOGRAM N/A 10/10/2014   Procedure: LEFT HEART CATHETERIZATION WITH CORONARY ANGIOGRAM;  Surgeon: Lesleigh Noe, MD;  Location: [facility] CATH LAB;  Service: Cardiovascular;  Laterality: N/A;  . None       OB History    No data available       Home Medications    Prior to Admission medications   Medication Sig Start Date End Date Taking? Authorizing Provider  amLODipine (NORVASC) 10 MG tablet Take 10 mg by mouth daily.   Yes Historical Provider, MD  hydrochlorothiazide (HYDRODIURIL) 25 MG tablet Take 1 tablet (25 mg total) by mouth daily. 10/25/16  Yes [name], MD  metoCLOPramide (REGLAN) 10 MG tablet Take 1 tablet (10 mg total) by mouth every 6 (six) hours. Patient taking differently: Take 10 mg by mouth every 6 (six) hours as needed for nausea or vomiting.  10/25/16  Yes [name], MD  potassium chloride SA (K-DUR,KLOR-CON) 20 MEQ tablet Take 1 tablet (20 mEq total) by mouth daily. 10/25/16  Yes [name], MD  HYDROcodone-acetaminophen (NORCO/VICODIN) 5-325 MG tablet Take 1-2 tablets by mouth every 6 (six) hours as needed for moderate pain or severe pain. 12/16/16   Tereasa Yilmaz, PA-C  nitrofurantoin, macrocrystal-monohydrate, (MACROBID) 100 MG capsule Take 1 capsule (100 mg total) by mouth 2 (two) times daily. X 7 days 12/16/16   Dierdre Forth, PA-C    Family History Family History  Problem Relation Age of Onset  . Hypertension Father   . Kidney disease Father   . Heart attack Father     Social History Social History  Substance Use Topics  . Smoking status:  Never Smoker  . Smokeless tobacco: Not on file  . Alcohol use 0.0 oz/week     Comment: Occasional glass of wine     Allergies   Codeine; Flexeril [cyclobenzaprine]; and Penicillins   Review of Systems Review of Systems  Gastrointestinal: Positive for abdominal pain.  Genitourinary: Positive for frequency and urgency.  All other systems reviewed and are negative.    Physical Exam Updated Vital Signs BP (!) 154/103   Pulse 86   Temp 98.4 F (36.9 C) (Oral)   Resp 16   LMP 11/30/2016 (Exact Date)   SpO2 95%   Physical Exam  Constitutional: She appears well-developed and well-nourished.  No distress.  Awake, alert, nontoxic appearance  HENT:  Head: Normocephalic and atraumatic.  Mouth/Throat: Oropharynx is clear and moist. No oropharyngeal exudate.  Eyes: Conjunctivae are normal. No scleral icterus.  Neck: Normal range of motion. Neck supple.  Cardiovascular: Normal rate, regular rhythm and intact distal pulses.   Pulmonary/Chest: Effort normal and breath sounds normal. No respiratory distress. She has no wheezes.  Equal chest expansion  Abdominal: Soft. Bowel sounds are normal. She exhibits no mass. There is tenderness in the suprapubic area. There is no rebound and no guarding.  Musculoskeletal: Normal range of motion. She exhibits no edema.  Neurological: She is alert.  Speech is clear and goal oriented Moves extremities without ataxia  Skin: Skin is warm and dry. She is not diaphoretic.  Psychiatric: She has a normal mood and affect.  Nursing note and vitals reviewed.    ED Treatments / Results  Labs (all labs ordered are listed, but only abnormal results are displayed) Labs Reviewed  COMPREHENSIVE METABOLIC PANEL - Abnormal; Notable for the following:       Result Value   Potassium 3.3 (*)    Chloride 95 (*)    Glucose, Bld 123 (*)    BUN 21 (*)    Creatinine, Ser 1.68 (*)    Total Bilirubin 0.2 (*)    GFR calc non Af Amer 37 (*)    GFR calc Af Amer 43 (*)    All other components within normal limits  CBC - Abnormal; Notable for the following:    RBC 5.24 (*)    All other components within normal limits  URINALYSIS, ROUTINE W REFLEX MICROSCOPIC - Abnormal; Notable for the following:    APPearance HAZY (*)    Leukocytes, UA TRACE (*)    Bacteria, UA RARE (*)    Squamous Epithelial / LPF 0-5 (*)    All other components within normal limits  URINE CULTURE  WET PREP, GENITAL  LIPASE, BLOOD  POC URINE PREG, ED  GC/CHLAMYDIA PROBE AMP ([place]) NOT AT [facility]    Radiology Ct Renal Stone Study  Result Date: 12/16/2016 CLINICAL DATA:  41 y/o  F;  left flank pain. EXAM: CT ABDOMEN AND PELVIS WITHOUT CONTRAST TECHNIQUE: Multidetector CT imaging of the abdomen and pelvis was performed following the standard protocol without IV contrast. COMPARISON:  None. FINDINGS: Lower chest: No acute abnormality. Hepatobiliary: Hepatic steatosis. Normal gallbladder. No intra or extrahepatic biliary ductal dilatation. Pancreas: Unremarkable. No pancreatic ductal dilatation or surrounding inflammatory changes. Spleen: Normal in size without focal abnormality. Adrenals/Urinary Tract: 15 mm left adrenal nodule measuring -1 HU compatible with adenoma. No urinary stone disease or hydronephrosis identified. Normal bladder. Stomach/Bowel: Stomach is within normal limits. Appendix appears normal. No evidence of bowel wall thickening, distention, or inflammatory changes. Vascular/Lymphatic: No significant vascular findings are present. No enlarged  abdominal or pelvic lymph nodes. Reproductive: Uterus and bilateral adnexa are unremarkable. Other: No abdominal wall hernia or abnormality. No abdominopelvic ascites. Musculoskeletal: No acute or significant osseous findings. IMPRESSION: 1. No acute process as explanation for abdominal pain. No hydronephrosis or urinary stone disease identified. 2. 15 mm left adrenal adenoma. 3. Hepatic steatosis. 4. Otherwise unremarkable CT of the abdomen and pelvis. Electronically Signed   By: Mitzi Hansen M.D.   On: 12/16/2016 03:11    Procedures Procedures (including critical care time)  Medications Ordered in ED Medications  potassium chloride SA (K-DUR,KLOR-CON) CR tablet 40 mEq (not administered)  sodium chloride 0.9 % bolus 500 mL (not administered)  fentaNYL (SUBLIMAZE) injection 50 mcg (50 mcg Intravenous Given 12/16/16 0311)  ondansetron (ZOFRAN) injection 4 mg (4 mg Intravenous Given 12/16/16 0311)     Initial Impression / Assessment and Plan / ED Course  I have reviewed the triage vital signs and the nursing  notes.  Pertinent labs & imaging results that were available during my care of the patient were reviewed by me and considered in my medical decision making (see chart for details).     Patient presents with severe suprapubic abdominal pain and left flank pain. Suspicious for possible nephrolithiasis versus renal colic versus PID versus pyelonephritis versus acute cystitis. Labs are reassuring. Pregnancy test negative. Urinalysis without evidence of urinary tract infection. Trace leukocytes 0-5 white blood cells and rare bacteria. CT renal without signs of stone. Of note, CT does show 15 mm left adrenal nodule compatible with an adenoma. Results were printed and given to patient. She will follow up with urology for this.    Mild hypokalemia, repleted in the emergency department. Slight elevation in her serum creatinine from baseline. 1.6 today up from 1.2. Patient given fluids. Request that she follow-up with primary care or home health and wellness for repeat blood work.  Patient without cervical motion tenderness or adnexal tenderness on colic exam. Cervix is not friable.  Patient noted to be hypertensive in the emergency department.  She is without chest pain or shortness of breath. No signs of hypertensive urgency.  Discussed with patient the need for close follow-up and management by their primary care physician.    Final Clinical Impressions(s) / ED Diagnoses   Final diagnoses:  Lower abdominal pain  Left flank discomfort  Adrenal mass (HCC)  Hypokalemia  Elevated serum creatinine    New Prescriptions New Prescriptions   HYDROCODONE-ACETAMINOPHEN (NORCO/VICODIN) 5-325 MG TABLET    Take 1-2 tablets by mouth every 6 (six) hours as needed for moderate pain or severe pain.   NITROFURANTOIN, MACROCRYSTAL-MONOHYDRATE, (MACROBID) 100 MG CAPSULE    Take 1 capsule (100 mg total) by mouth 2 (two) times daily. X 7 days     Dierdre Forth, PA-C 12/16/16 1610    Zadie Rhine,  MD 12/17/16 [phone]

## 2016-12-16 NOTE — Discharge Instructions (Signed)
1. Medications: vicodin for severe pain, macrobid for possible UTI, usual home medications 2. Treatment: rest, drink plenty of fluids,  3. Follow Up: Please followup with your primary doctor in 2-4 days for discussion of your diagnoses and further evaluation after today's visit; if you do not have a primary care doctor use the resource guide provided to find one; Please return to the ER for worsening pain, persistent vomiting, fevers or other concerns

## 2016-12-17 LAB — URINE CULTURE

## 2017-02-14 ENCOUNTER — Encounter (HOSPITAL_COMMUNITY): Payer: Self-pay | Admitting: Emergency Medicine

## 2017-02-14 ENCOUNTER — Observation Stay (HOSPITAL_COMMUNITY)
Admission: EM | Admit: 2017-02-14 | Discharge: 2017-02-15 | Disposition: A | Discharge status: Home / Self Care | Payer: Self-pay | Attending: Internal Medicine | Admitting: Internal Medicine

## 2017-02-14 ENCOUNTER — Emergency Department (HOSPITAL_COMMUNITY): Payer: Self-pay

## 2017-02-14 ENCOUNTER — Observation Stay (HOSPITAL_BASED_OUTPATIENT_CLINIC_OR_DEPARTMENT_OTHER): Payer: Self-pay

## 2017-02-14 DIAGNOSIS — Z79899 Other long term (current) drug therapy: Secondary | ICD-10-CM | POA: Insufficient documentation

## 2017-02-14 DIAGNOSIS — R079 Chest pain, unspecified: Secondary | ICD-10-CM

## 2017-02-14 DIAGNOSIS — Z9114 Patient's other noncompliance with medication regimen: Secondary | ICD-10-CM | POA: Insufficient documentation

## 2017-02-14 DIAGNOSIS — I119 Hypertensive heart disease without heart failure: Secondary | ICD-10-CM | POA: Diagnosis present

## 2017-02-14 DIAGNOSIS — E876 Hypokalemia: Secondary | ICD-10-CM | POA: Insufficient documentation

## 2017-02-14 DIAGNOSIS — Z7982 Long term (current) use of aspirin: Secondary | ICD-10-CM | POA: Insufficient documentation

## 2017-02-14 DIAGNOSIS — I160 Hypertensive urgency: Secondary | ICD-10-CM

## 2017-02-14 DIAGNOSIS — I5042 Chronic combined systolic (congestive) and diastolic (congestive) heart failure: Secondary | ICD-10-CM | POA: Insufficient documentation

## 2017-02-14 DIAGNOSIS — N179 Acute kidney failure, unspecified: Secondary | ICD-10-CM | POA: Insufficient documentation

## 2017-02-14 DIAGNOSIS — Z880 Allergy status to penicillin: Secondary | ICD-10-CM | POA: Insufficient documentation

## 2017-02-14 DIAGNOSIS — I509 Heart failure, unspecified: Secondary | ICD-10-CM

## 2017-02-14 DIAGNOSIS — I2510 Atherosclerotic heart disease of native coronary artery without angina pectoris: Secondary | ICD-10-CM | POA: Insufficient documentation

## 2017-02-14 DIAGNOSIS — Z6841 Body Mass Index (BMI) 40.0 and over, adult: Secondary | ICD-10-CM | POA: Insufficient documentation

## 2017-02-14 DIAGNOSIS — I5032 Chronic diastolic (congestive) heart failure: Secondary | ICD-10-CM

## 2017-02-14 DIAGNOSIS — I110 Hypertensive heart disease with heart failure: Principal | ICD-10-CM | POA: Insufficient documentation

## 2017-02-14 LAB — RAPID URINE DRUG SCREEN, HOSP PERFORMED
Amphetamines: NOT DETECTED
BARBITURATES: NOT DETECTED
Benzodiazepines: NOT DETECTED
Cocaine: NOT DETECTED
Opiates: NOT DETECTED
TETRAHYDROCANNABINOL: NOT DETECTED

## 2017-02-14 LAB — BASIC METABOLIC PANEL
Anion gap: 8 (ref 5–15)
BUN: 16 mg/dL (ref 6–20)
CHLORIDE: 103 mmol/L (ref 101–111)
CO2: 26 mmol/L (ref 22–32)
Calcium: 8.9 mg/dL (ref 8.9–10.3)
Creatinine, Ser: 1.07 mg/dL — ABNORMAL HIGH (ref 0.44–1.00)
GFR calc Af Amer: >60 mL/min (ref >60)
GFR calc non Af Amer: >60 mL/min (ref >60)
GLUCOSE: 131 mg/dL — AB (ref 65–99)
POTASSIUM: 3 mmol/L — AB (ref 3.5–5.1)
SODIUM: 137 mmol/L (ref 135–145)

## 2017-02-14 LAB — POCT I-STAT TROPONIN I: Troponin i, poc: 0.01 ng/mL (ref 0.00–0.08)

## 2017-02-14 LAB — MRSA PCR SCREENING: MRSA BY PCR: NEGATIVE

## 2017-02-14 LAB — CBC
HCT: 36.8 % (ref 36.0–46.0)
HEMATOCRIT: 39.1 % (ref 36.0–46.0)
HEMOGLOBIN: 12.4 g/dL (ref 12.0–15.0)
Hemoglobin: 13 g/dL (ref 12.0–15.0)
MCH: 27.1 pg (ref 26.0–34.0)
MCH: 27.4 pg (ref 26.0–34.0)
MCHC: 33.2 g/dL (ref 30.0–36.0)
MCHC: 33.7 g/dL (ref 30.0–36.0)
MCV: 81.5 fL (ref 78.0–100.0)
MCV: 81.2 fL (ref 78.0–100.0)
Platelets: 322 10*3/uL (ref 150–400)
Platelets: 298 10*3/uL (ref 150–400)
RBC: 4.8 MIL/uL (ref 3.87–5.11)
RBC: 4.53 MIL/uL (ref 3.87–5.11)
RDW: 15 % (ref 11.5–15.5)
RDW: 14.9 % (ref 11.5–15.5)
WBC: 6.7 10*3/uL (ref 4.0–10.5)
WBC: 6.7 10*3/uL (ref 4.0–10.5)

## 2017-02-14 LAB — CREATININE, SERUM
CREATININE: 1.25 mg/dL — AB (ref 0.44–1.00)
GFR calc Af Amer: >60 mL/min (ref >60)
GFR, EST NON AFRICAN AMERICAN: 53 mL/min — AB (ref >60)

## 2017-02-14 LAB — ECHOCARDIOGRAM COMPLETE

## 2017-02-14 LAB — TROPONIN I: Troponin I: <0.03 ng/mL (ref <0.03)

## 2017-02-14 LAB — BRAIN NATRIURETIC PEPTIDE: B NATRIURETIC PEPTIDE 5: 30.9 pg/mL (ref 0.0–100.0)

## 2017-02-14 LAB — PREGNANCY, URINE: Preg Test, Ur: NEGATIVE

## 2017-02-14 LAB — TSH: TSH: 3.497 u[IU]/mL (ref 0.350–4.500)

## 2017-02-14 MED ORDER — IBUPROFEN 200 MG PO TABS
600.0000 mg | ORAL_TABLET | Freq: Once | ORAL | Status: DC
  Filled 2017-02-14: qty 3

## 2017-02-14 MED ORDER — PROCHLORPERAZINE EDISYLATE 5 MG/ML IJ SOLN
10.0000 mg | Freq: Once | INTRAMUSCULAR | Status: AC
  Administered 2017-02-14: 10 mg via INTRAVENOUS
  Filled 2017-02-14: qty 2

## 2017-02-14 MED ORDER — NITROGLYCERIN 2 % TD OINT
0.5000 [in_us] | TOPICAL_OINTMENT | Freq: Four times a day (QID) | TRANSDERMAL | Status: DC
  Administered 2017-02-14 (×2): 0.5 [in_us] via TOPICAL
  Filled 2017-02-14: qty 1
  Filled 2017-02-14: qty 30

## 2017-02-14 MED ORDER — CLONIDINE HCL 0.1 MG PO TABS
0.2000 mg | ORAL_TABLET | Freq: Once | ORAL | Status: AC
  Administered 2017-02-14: 0.2 mg via ORAL
  Filled 2017-02-14: qty 2

## 2017-02-14 MED ORDER — METOCLOPRAMIDE HCL 5 MG/5ML PO SOLN
10.0000 mg | Freq: Once | ORAL | Status: AC
  Administered 2017-02-15: 10 mg via ORAL
  Filled 2017-02-14: qty 10

## 2017-02-14 MED ORDER — DIPHENHYDRAMINE HCL 50 MG/ML IJ SOLN
25.0000 mg | Freq: Once | INTRAMUSCULAR | Status: AC
  Administered 2017-02-15: 25 mg via INTRAVENOUS
  Filled 2017-02-14: qty 1

## 2017-02-14 MED ORDER — HEPARIN SODIUM (PORCINE) 5000 UNIT/ML IJ SOLN
5000.0000 [IU] | Freq: Three times a day (TID) | INTRAMUSCULAR | Status: DC
  Administered 2017-02-14 – 2017-02-15 (×3): 5000 [IU] via SUBCUTANEOUS
  Filled 2017-02-14 (×3): qty 1

## 2017-02-14 MED ORDER — DIPHENHYDRAMINE HCL 50 MG/ML IJ SOLN
25.0000 mg | Freq: Once | INTRAMUSCULAR | Status: AC
  Administered 2017-02-14: 25 mg via INTRAVENOUS
  Filled 2017-02-14: qty 1

## 2017-02-14 MED ORDER — IBUPROFEN 200 MG PO TABS: 400.0000 mg | ORAL_TABLET | Freq: Four times a day (QID) | ORAL | Status: DC | PRN

## 2017-02-14 MED ORDER — HYDROCODONE-ACETAMINOPHEN 5-325 MG PO TABS: 1.0000 | ORAL_TABLET | Freq: Four times a day (QID) | ORAL | Status: DC | PRN

## 2017-02-14 MED ORDER — ISOSORBIDE MONONITRATE ER 60 MG PO TB24
30.0000 mg | ORAL_TABLET | Freq: Every day | ORAL | Status: DC
  Administered 2017-02-14 – 2017-02-15 (×2): 30 mg via ORAL
  Filled 2017-02-14 (×2): qty 1

## 2017-02-14 MED ORDER — NITROGLYCERIN 0.4 MG SL SUBL: 0.4000 mg | SUBLINGUAL_TABLET | SUBLINGUAL | Status: DC | PRN

## 2017-02-14 MED ORDER — ONDANSETRON HCL 4 MG/2ML IJ SOLN: 4.0000 mg | Freq: Four times a day (QID) | INTRAMUSCULAR | Status: DC | PRN

## 2017-02-14 MED ORDER — AMLODIPINE BESYLATE 10 MG PO TABS
10.0000 mg | ORAL_TABLET | Freq: Every day | ORAL | Status: DC
  Administered 2017-02-14 – 2017-02-15 (×2): 10 mg via ORAL
  Filled 2017-02-14: qty 2
  Filled 2017-02-14: qty 1

## 2017-02-14 MED ORDER — ASPIRIN 81 MG PO CHEW
324.0000 mg | CHEWABLE_TABLET | Freq: Once | ORAL | Status: AC
  Administered 2017-02-14: 324 mg via ORAL
  Filled 2017-02-14: qty 4

## 2017-02-14 MED ORDER — HYDRALAZINE HCL 20 MG/ML IJ SOLN
10.0000 mg | Freq: Four times a day (QID) | INTRAMUSCULAR | Status: DC | PRN
  Administered 2017-02-15: 10 mg via INTRAVENOUS
  Filled 2017-02-14 (×2): qty 1

## 2017-02-14 MED ORDER — POTASSIUM CHLORIDE CRYS ER 20 MEQ PO TBCR
40.0000 meq | EXTENDED_RELEASE_TABLET | Freq: Once | ORAL | Status: AC
  Administered 2017-02-14: 40 meq via ORAL

## 2017-02-14 MED ORDER — OXYCODONE HCL 5 MG PO TABS
5.0000 mg | ORAL_TABLET | Freq: Once | ORAL | Status: AC
  Administered 2017-02-14: 5 mg via ORAL
  Filled 2017-02-14: qty 1

## 2017-02-14 MED ORDER — POTASSIUM CHLORIDE CRYS ER 20 MEQ PO TBCR: 20.0000 meq | EXTENDED_RELEASE_TABLET | Freq: Once | ORAL | Status: DC

## 2017-02-14 MED ORDER — ONDANSETRON HCL 4 MG PO TABS: 4.0000 mg | ORAL_TABLET | Freq: Four times a day (QID) | ORAL | Status: DC | PRN

## 2017-02-14 MED ORDER — KETOROLAC TROMETHAMINE 30 MG/ML IJ SOLN
30.0000 mg | Freq: Once | INTRAMUSCULAR | Status: AC
  Administered 2017-02-15: 30 mg via INTRAVENOUS
  Filled 2017-02-14: qty 1

## 2017-02-14 MED ORDER — ALBUTEROL SULFATE (2.5 MG/3ML) 0.083% IN NEBU: 2.5000 mg | INHALATION_SOLUTION | RESPIRATORY_TRACT | Status: DC | PRN

## 2017-02-14 MED ORDER — SODIUM CHLORIDE 0.9% FLUSH
3.0000 mL | Freq: Two times a day (BID) | INTRAVENOUS | Status: DC
  Administered 2017-02-14 – 2017-02-15 (×2): 3 mL via INTRAVENOUS

## 2017-02-14 MED ORDER — ACETAMINOPHEN 325 MG PO TABS: 650.0000 mg | ORAL_TABLET | Freq: Four times a day (QID) | ORAL | Status: DC | PRN

## 2017-02-14 MED ORDER — POTASSIUM CHLORIDE CRYS ER 20 MEQ PO TBCR
40.0000 meq | EXTENDED_RELEASE_TABLET | Freq: Four times a day (QID) | ORAL | Status: DC
  Administered 2017-02-14: 40 meq via ORAL
  Filled 2017-02-14 (×2): qty 2

## 2017-02-14 MED ORDER — CARVEDILOL 6.25 MG PO TABS
6.2500 mg | ORAL_TABLET | Freq: Two times a day (BID) | ORAL | Status: DC
  Administered 2017-02-14 – 2017-02-15 (×2): 6.25 mg via ORAL
  Filled 2017-02-14 (×2): qty 1

## 2017-02-14 MED ORDER — FUROSEMIDE 10 MG/ML IJ SOLN
40.0000 mg | Freq: Once | INTRAMUSCULAR | Status: AC
  Administered 2017-02-14: 40 mg via INTRAVENOUS
  Filled 2017-02-14: qty 4

## 2017-02-14 NOTE — ED Notes (Signed)
Will give bedside report.  

## 2017-02-14 NOTE — ED Triage Notes (Signed)
Pt reports she began to have CP, SOB, and lightheadedness this am. Pt speaking in full sentences with no apparent difficulty.

## 2017-02-14 NOTE — Progress Notes (Signed)
  Echocardiogram 2D Echocardiogram has been performed.  Leta JunglingCooper, Valerian Jewel M 02/14/2017, 3:24 PM

## 2017-02-14 NOTE — ED Notes (Signed)
Echocardiogram being done at bedside at present.

## 2017-02-14 NOTE — ED Notes (Signed)
Bed: WA09 Expected date:  Expected time:  Means of arrival:  Comments: Pt in lobby, MD 40

## 2017-02-14 NOTE — H&P (Signed)
[facility] H&P   Patient Demographics:    Theresa Soto, is a 41 y.o. female  MRN: [id number]   DOB - [birth year]  Admit Date - 02/14/2017  Outpatient Primary MD for the patient is Patient, No Pcp Per      Patient coming from: Home  Chief Complaint  Patient presents with  . Chest Pain      HPI:    Theresa Soto  is a 41 y.o. female, History of poorly controlled hypertension, nonocclusive CAD per cardiac catheterization in 2016, morbid obesity BMI greater than 40, chronic diastolic CHF EF around 55% who lives at home and comes to the hospital with some chest discomfort, weight headache continuing numbness mostly on the left side of her body starting this morning, symptoms continued so she came to the ER where she was found to have extremely high but pressures of 220/110, EKG was unchanged, lab work showed mild hypokalemia and I was called to admit the patient.  Currently she is having minimal to no chest pressure, this is nonradiating, no aggravating or relieving factors, not associated with any other symptoms, she does say that she had noticed for the last few days that upon lying flat she might get a little short of breath, does have mild generalized headache. Note any numbness or any focal weakness. No other subjective complaints.    Review of systems:    In addition to the HPI above,   No Fever-chills, Mild generalized Headache, No changes with Vision or hearing, No problems swallowing food or Liquids, Dull chest pressure, mild orthopnea, no shortness of breath on sitting up, no Cough  No Abdominal pain, No Nausea or Vommitting, Bowel movements are regular, No Blood in stool or Urine, No dysuria, No new skin  rashes or bruises, No new joints pains-aches,  No new weakness, tingling, numbness in any extremity, No recent weight gain or loss, No polyuria, polydypsia or polyphagia, No significant Mental Stressors.  A full 10 point Review of Systems was done, except as stated above, all other Review of Systems were negative.   With Past History of the following :    Past Medical History:  Diagnosis Date  . CAD (coronary artery disease)    elevated TnI 1/16 in setting of HTN urgency (demand ischemia) >> LHC 1/16:  prox to mid LAD 40%, EF  55%  . Chronic diastolic CHF (congestive heart failure) (HCC)   . Hypertensive heart disease    a.  Echo 1/16:  severe LVH, EF 55-60%, severe LAE;  b.  Renal Art [place] 1/16:  no RAS      Past Surgical History:  Procedure Laterality Date  . LEFT HEART CATHETERIZATION WITH CORONARY ANGIOGRAM N/A 10/10/2014   Procedure: LEFT HEART CATHETERIZATION WITH CORONARY ANGIOGRAM;  Surgeon: Lesleigh Noe, MD;  Location: [facility] CATH LAB;  Service: Cardiovascular;  Laterality: N/A;  . None        Social History:     Social History  Substance Use Topics  . Smoking status: Never Smoker  . Smokeless tobacco: Not on file  . Alcohol use 0.0 oz/week     Comment: Occasional glass of wine         Family History :     Family History  Problem Relation Age of Onset  . Hypertension Father   . Kidney disease Father   . Heart attack Father        Home Medications:   Prior to Admission medications   Medication Sig Start Date End Date Taking? Authorizing Provider  amLODipine (NORVASC) 10 MG tablet Take 10 mg by mouth daily.   Yes [provider]  hydrochlorothiazide (HYDRODIURIL) 25 MG tablet Take 1 tablet (25 mg total) by mouth daily. 10/25/16  Yes [name], MD  potassium chloride SA (K-DUR,KLOR-CON) 20 MEQ tablet Take 1 tablet (20 mEq total) by mouth daily. 10/25/16  Yes [name], MD  HYDROcodone-acetaminophen (NORCO/VICODIN) 5-325 MG tablet Take 1-2  tablets by mouth every 6 (six) hours as needed for moderate pain or severe pain. Patient not taking: Reported on 02/14/2017 12/16/16   Muthersbaugh, Dahlia Client, PA-C  metoCLOPramide (REGLAN) 10 MG tablet Take 1 tablet (10 mg total) by mouth every 6 (six) hours. Patient not taking: Reported on 02/14/2017 10/25/16   [name], MD  nitrofurantoin, macrocrystal-monohydrate, (MACROBID) 100 MG capsule Take 1 capsule (100 mg total) by mouth 2 (two) times daily. X 7 days Patient not taking: Reported on 02/14/2017 12/16/16   Muthersbaugh, Dahlia Client, PA-C     Allergies:     Allergies  Allergen Reactions  . Acetaminophen     hives  . Codeine     Hives  . Flexeril [Cyclobenzaprine] Hives  . Penicillins Itching and Rash    Has patient had a PCN reaction causing immediate rash, facial/tongue/throat swelling, SOB or lightheadedness with hypotension: yes Has patient had a PCN reaction causing severe rash involving mucus membranes or skin necrosis: yes Has patient had a PCN reaction that required hospitalization: no Has patient had a PCN reaction occurring within the last 10 years: no If all of the above answers are "NO", then may proceed with Cephalosporin use.     Physical Exam:   Vitals  Blood pressure (!) 208/127, pulse 73, temperature 98 F (36.7 C), temperature source Oral, resp. rate 18, last menstrual period 01/30/2017, SpO2 99 %.   1. General Morbidly obese middle-aged African-American female lying in bed in NAD,    2. Normal affect and insight, Not Suicidal or Homicidal, Awake Alert, Oriented X 3.  3. No F.N deficits, ALL C.Nerves Intact, Strength 5/5 all 4 extremities, Sensation intact all 4 extremities, Plantars down going.  4. Ears and Eyes appear Normal, Conjunctivae clear, PERRLA. Moist Oral Mucosa.  5. Supple Neck, No JVD, No cervical lymphadenopathy appriciated, No Carotid Bruits.  6. Symmetrical Chest wall movement, Good air movement  bilaterally, few bibasilar rales  7. RRR, No  Gallops, Rubs or Murmurs, No Parasternal Heave.  8. Positive Bowel Sounds, Abdomen Soft, No tenderness, No organomegaly appriciated,No rebound -guarding or rigidity.  9.  No Cyanosis, Normal Skin Turgor, No Skin Rash or Bruise. Trace bipedal edema  10. Good muscle tone,  joints appear normal , no effusions, Normal ROM.  11. No Palpable Lymph Nodes in Neck or Axillae      Data Review:    CBC  Recent Labs Lab 02/14/17 0918  WBC 6.7  HGB 13.0  HCT 39.1  PLT 322  MCV 81.5  MCH 27.1  MCHC 33.2  RDW 15.0   ------------------------------------------------------------------------------------------------------------------  Chemistries   Recent Labs Lab 02/14/17 0918  NA 137  K 3.0*  CL 103  CO2 26  GLUCOSE 131*  BUN 16  CREATININE 1.07*  CALCIUM 8.9   ------------------------------------------------------------------------------------------------------------------ CrCl cannot be calculated (Unknown ideal weight.). ------------------------------------------------------------------------------------------------------------------ No results for input(s): TSH, T4TOTAL, T3FREE, THYROIDAB in the last 72 hours.  Invalid input(s): FREET3  Coagulation profile No results for input(s): INR, PROTIME in the last 168 hours. ------------------------------------------------------------------------------------------------------------------- No results for input(s): DDIMER in the last 72 hours. -------------------------------------------------------------------------------------------------------------------  Cardiac Enzymes No results for input(s): CKMB, TROPONINI, MYOGLOBIN in the last 168 hours.  Invalid input(s): CK ------------------------------------------------------------------------------------------------------------------    Component Value Date/Time   BNP 22.1 10/08/2014 1343      ---------------------------------------------------------------------------------------------------------------  Urinalysis    Component Value Date/Time   COLORURINE YELLOW 12/15/2016 2016   APPEARANCEUR HAZY (A) 12/15/2016 2016   LABSPEC 1.012 12/15/2016 2016   PHURINE 5.0 12/15/2016 2016   GLUCOSEU NEGATIVE 12/15/2016 2016   HGBUR NEGATIVE 12/15/2016 2016   BILIRUBINUR NEGATIVE 12/15/2016 2016   KETONESUR NEGATIVE 12/15/2016 2016   PROTEINUR NEGATIVE 12/15/2016 2016   UROBILINOGEN 0.2 10/10/2014 2055   NITRITE NEGATIVE 12/15/2016 2016   LEUKOCYTESUR TRACE (A) 12/15/2016 2016    ----------------------------------------------------------------------------------------------------------------   Imaging Results:    Dg Chest 2 View  Result Date: 02/14/2017 CLINICAL DATA:  Chest pain and shortness of breath EXAM: CHEST  2 VIEW COMPARISON:  October 24, 2016 FINDINGS: There is no edema or consolidation. Heart is borderline prominent with pulmonary vascularity within normal limits. No adenopathy. No pneumothorax. No bone lesions. IMPRESSION: Heart borderline prominent.  No edema or consolidation. Electronically Signed   By: Bretta Bang III M.D.   On: 02/14/2017 09:43    My personal review of EKG: Rhythm NSR, Chronic lateral T-wave inversion likely stress pattern     Assessment & Plan:      1. Hypertensive urgency causing some chest pressure and generalized headache. Patient will receive nitro paste, Norvasc and Coreg in the ER along with as needed IV hydralazine, will keep her in stepdown of blood pressures don't respond we'll start her on Micardis pain drip on nitro drip.  2. Atypical chest pain in a patient with nonocclusive CAD. Likely due to #1 above, cycle troponin, aspirin and beta blocker, check echocardiogram to evaluate wall motion and EF, cardiology input in the morning. Doubt she will require another stress test.  3. Morbid obesity. Follow with PCP for weight  loss.  4. Mild renal chronic diastolic CHF EF 55%. Brought on by #1 above, blood pressure control as above, Lasix 40 g IV 1 now. Monitor symptoms.  5. Hypokalemia. Replace and monitor. Likely due to HCTZ use.   DVT Prophylaxis Heparin    AM Labs Ordered, also please review Full Orders  Family Communication: Admission, patients condition and plan  of care including tests being ordered have been discussed with the patient  who indicates understanding and agree with the plan and Code Status.  Code Status Full  Likely DC to  Home  Condition GUARDED    Consults called: Cards    Admission status: Obs    Time spent in minutes : 35   Susa RaringPrashant Kashlyn Salinas M.D on 02/14/2017 at 12:58 PM  Between 7am to 7pm - Pager - [phone] ( page via [facility], text pages only, please mention full 10 digit call back number).  After 7pm go to [email or web address] - password [email or web address]  [facility] - Office  [phone]

## 2017-02-14 NOTE — ED Provider Notes (Signed)
[facility]-EMERGENCY DEPT Provider Note   CSN: [id number] Arrival date & time: 02/14/17  [id number]     History   Chief Complaint Chief Complaint  Patient presents with  . Chest Pain    HPI Theresa Soto is a 41 y.o. female.  The history is provided by the patient.  Chest Pain   This is a new problem. The current episode started 3 to 5 hours ago. The problem occurs constantly. The problem has not changed since onset.The pain is associated with rest (nothing). The pain is present in the substernal region. The pain is mild. The quality of the pain is described as pressure-like. The pain does not radiate. Exacerbated by: nothing. Associated symptoms include dizziness, headaches, malaise/fatigue, nausea and shortness of breath. Pertinent negatives include no abdominal pain, no back pain, no cough, no diaphoresis, no exertional chest pressure, no fever, no leg pain, no lower extremity edema, no near-syncope, no numbness, no syncope, no vomiting and no weakness. She has tried nothing for the symptoms. The treatment provided no relief. Risk factors include obesity and sedentary lifestyle.  Her past medical history is significant for CAD and hypertension.  Procedure history is positive for cardiac catheterization.    Past Medical History:  Diagnosis Date  . CAD (coronary artery disease)    elevated TnI 1/16 in setting of HTN urgency (demand ischemia) >> LHC 1/16:  prox to mid LAD 40%, EF 55%  . Chronic diastolic CHF (congestive heart failure) (HCC)   . Hypertensive heart disease    a.  Echo 1/16:  severe LVH, EF 55-60%, severe LAE;  b.  Renal Art US 1/16:  no RAS    Patient Active Problem List   Diagnosis Date Noted  . Hypertensive heart disease   . CAD (coronary artery disease)   . Chronic diastolic CHF (congestive heart failure) (HCC)   . Cephalalgia   . Pain in the chest   . AKI (acute kidney injury) (HCC) 10/10/2014  . Elevated troponin I level 10/10/2014  . Other chest pain 10/10/2014  .  Hypertensive urgency 10/08/2014    Past Surgical History:  Procedure Laterality Date  . LEFT HEART CATHETERIZATION WITH CORONARY ANGIOGRAM N/A 10/10/2014   Procedure: LEFT HEART CATHETERIZATION WITH CORONARY ANGIOGRAM;  Surgeon: Lesleigh NoeHenry W Smith III, MD;  [facility];  Service: Cardiovascular;  Laterality: N/A;  . None      OB History    No data available       Home Medications    Prior to Admission medications   Medication Sig Start Date End Date Taking? Authorizing Provider  amLODipine (NORVASC) 10 MG tablet Take 10 mg by mouth daily.   Yes [provider]  hydrochlorothiazide (HYDRODIURIL) 25 MG tablet Take 1 tablet (25 mg total) by mouth daily. 10/25/16  Yes Dione BoozeGlick, David, MD  potassium chloride SA (K-DUR,KLOR-CON) 20 MEQ tablet Take 1 tablet (20 mEq total) by mouth daily. 10/25/16  Yes Dione BoozeGlick, David, MD  HYDROcodone-acetaminophen (NORCO/VICODIN) 5-325 MG tablet Take 1-2 tablets by mouth every 6 (six) hours as needed for moderate pain or severe pain. Patient not taking: Reported on 02/14/2017 12/16/16   Muthersbaugh, Dahlia ClientHannah, PA-C  metoCLOPramide (REGLAN) 10 MG tablet Take 1 tablet (10 mg total) by mouth every 6 (six) hours. Patient not taking: Reported on 02/14/2017 10/25/16   Dione BoozeGlick, David, MD  nitrofurantoin, macrocrystal-monohydrate, (MACROBID) 100 MG capsule Take 1 capsule (100 mg total) by mouth 2 (two) times daily. X 7 days Patient not taking: Reported on 02/14/2017 12/16/16  Muthersbaugh, Dahlia Client, PA-C    Family History Family History  Problem Relation Age of Onset  . Hypertension Father   . Kidney disease Father   . Heart attack Father     Social History Social History  Substance Use Topics  . Smoking status: Never Smoker  . Smokeless tobacco: Not on file  . Alcohol use 0.0 oz/week     Comment: Occasional glass of wine     Allergies   Acetaminophen; Codeine; Flexeril [cyclobenzaprine]; and Penicillins   Review of Systems Review of Systems    Constitutional: Positive for malaise/fatigue. Negative for diaphoresis and fever.  Respiratory: Positive for shortness of breath. Negative for cough.   Cardiovascular: Positive for chest pain. Negative for syncope and near-syncope.  Gastrointestinal: Positive for nausea. Negative for abdominal pain and vomiting.  Musculoskeletal: Negative for back pain.  Neurological: Positive for dizziness and headaches. Negative for weakness and numbness.  All other systems reviewed and are negative.    Physical Exam Updated Vital Signs BP (!) 208/127 (BP Location: Other (Comment)) Comment (BP Location): RT forearm w/regular cuff  Pulse 73   Temp 98 F (36.7 C) (Oral)   Resp 18   LMP 01/30/2017   SpO2 99%   Physical Exam Physical Exam  Nursing note and vitals reviewed. Constitutional: Well developed, well nourished, non-toxic, and in no acute distress Head: Normocephalic and atraumatic.  Mouth/Throat: Oropharynx is clear and moist.  Neck: Normal range of motion. Neck supple.  Cardiovascular: Normal rate and regular rhythm.   Pulmonary/Chest: Effort normal and breath sounds normal.  Abdominal: Soft. There is no tenderness. There is no rebound and no guarding.  Musculoskeletal: Normal range of motion. no edema Neurological: Alert, no facial droop, fluent speech, moves all extremities symmetrically Skin: Skin is warm and dry.  Psychiatric: Cooperative   ED Treatments / Results  Labs (all labs ordered are listed, but only abnormal results are displayed) Labs Reviewed  BASIC METABOLIC PANEL - Abnormal; Notable for the following:       Result Value   Potassium 3.0 (*)    Glucose, Bld 131 (*)    Creatinine, Ser 1.07 (*)    All other components within normal limits  CBC  PREGNANCY, URINE  TROPONIN I  TROPONIN I  I-STAT TROPOININ, ED  POCT I-STAT TROPONIN I    EKG  EKG Interpretation  Date/Time:  Tuesday Feb 14 2017 09:16:42 EDT Ventricular Rate:  75 PR Interval:    QRS  Duration: 104 QT Interval:  416 QTC Calculation: 465 R Axis:   9 Text Interpretation:  Sinus rhythm Borderline prolonged PR interval LVH with secondary repolarization abnormality No STEMI. Similar to prior.  Confirmed by Alona Bene [phone]) on 02/14/2017 9:21:24 AM       Radiology Dg Chest 2 View  Result Date: 02/14/2017 CLINICAL DATA:  Chest pain and shortness of breath EXAM: CHEST  2 VIEW COMPARISON:  October 24, 2016 FINDINGS: There is no edema or consolidation. Heart is borderline prominent with pulmonary vascularity within normal limits. No adenopathy. No pneumothorax. No bone lesions. IMPRESSION: Heart borderline prominent.  No edema or consolidation. Electronically Signed   By: Bretta Bang III M.D.   On: 02/14/2017 09:43    Procedures Procedures (including critical care time)  Medications Ordered in ED Medications  diphenhydrAMINE (BENADRYL) injection 25 mg (not administered)  amLODipine (NORVASC) tablet 10 mg (not administered)  carvedilol (COREG) tablet 6.25 mg (not administered)  nitroGLYCERIN (NITROGLYN) 2 % ointment 0.5 inch (not administered)  hydrALAZINE (APRESOLINE)  injection 10 mg (not administered)  potassium chloride SA (K-DUR,KLOR-CON) CR tablet 40 mEq (not administered)  cloNIDine (CATAPRES) tablet 0.2 mg (0.2 mg Oral Given 02/14/17 1145)  aspirin chewable tablet 324 mg (324 mg Oral Given 02/14/17 1244)  prochlorperazine (COMPAZINE) injection 10 mg (10 mg Intravenous Given 02/14/17 1245)     Initial Impression / Assessment and Plan / ED Course  I have reviewed the triage vital signs and the nursing notes.  Pertinent labs & imaging results that were available during my care of the patient were reviewed by me and considered in my medical decision making (see chart for details).     Presenting with persistent chest pressure, shortness of breath, fatigue since this morning. With history of known CAD and hypertension. She has been compliant on her medications,  but hypertensive with systolic blood pressures in the 200s over 120s. Has taken her medications for this morning. Her EKG shows no acute ischemia. Her troponin 1 is normal. Suspect that her symptoms are likely from uncontrolled hypertension. Suspicion for dissection is low at this time. Did receive dose of clonidine with mild improvement in BP. Discussed with Dr. Thedore Mins who will admit for ACS rule out and hypertensive urgency.  Final Clinical Impressions(s) / ED Diagnoses   Final diagnoses:  Nonspecific chest pain  Hypertensive urgency    New Prescriptions New Prescriptions   No medications on file     Lavera Guise, MD 02/14/17 1249

## 2017-02-15 DIAGNOSIS — I10 Essential (primary) hypertension: Secondary | ICD-10-CM

## 2017-02-15 DIAGNOSIS — I509 Heart failure, unspecified: Secondary | ICD-10-CM

## 2017-02-15 DIAGNOSIS — R072 Precordial pain: Secondary | ICD-10-CM

## 2017-02-15 LAB — BASIC METABOLIC PANEL
Anion gap: 6 (ref 5–15)
BUN: 21 mg/dL — ABNORMAL HIGH (ref 6–20)
CHLORIDE: 104 mmol/L (ref 101–111)
CO2: 26 mmol/L (ref 22–32)
Calcium: 9 mg/dL (ref 8.9–10.3)
Creatinine, Ser: 1.31 mg/dL — ABNORMAL HIGH (ref 0.44–1.00)
GFR calc Af Amer: 58 mL/min — ABNORMAL LOW (ref >60)
GFR calc non Af Amer: 50 mL/min — ABNORMAL LOW (ref >60)
Glucose, Bld: 106 mg/dL — ABNORMAL HIGH (ref 65–99)
POTASSIUM: 3.7 mmol/L (ref 3.5–5.1)
Sodium: 136 mmol/L (ref 135–145)

## 2017-02-15 LAB — CBC
HCT: 35.8 % — ABNORMAL LOW (ref 36.0–46.0)
Hemoglobin: 11.7 g/dL — ABNORMAL LOW (ref 12.0–15.0)
MCH: 26.6 pg (ref 26.0–34.0)
MCHC: 32.7 g/dL (ref 30.0–36.0)
MCV: 81.4 fL (ref 78.0–100.0)
Platelets: 294 10*3/uL (ref 150–400)
RBC: 4.4 MIL/uL (ref 3.87–5.11)
RDW: 15 % (ref 11.5–15.5)
WBC: 8 10*3/uL (ref 4.0–10.5)

## 2017-02-15 LAB — TROPONIN I: Troponin I: <0.03 ng/mL (ref <0.03)

## 2017-02-15 LAB — HIV ANTIBODY (ROUTINE TESTING W REFLEX): HIV Screen 4th Generation wRfx: NONREACTIVE

## 2017-02-15 LAB — MAGNESIUM: Magnesium: 2 mg/dL (ref 1.7–2.4)

## 2017-02-15 MED ORDER — LISINOPRIL 20 MG PO TABS: 20.0000 mg | ORAL_TABLET | Freq: Every day | ORAL | 1 refills | Status: DC

## 2017-02-15 MED ORDER — TRAMADOL HCL 50 MG PO TABS
50.0000 mg | ORAL_TABLET | Freq: Once | ORAL | Status: AC
  Administered 2017-02-15: 50 mg via ORAL
  Filled 2017-02-15: qty 1

## 2017-02-15 MED ORDER — CARVEDILOL 12.5 MG PO TABS: 12.5000 mg | ORAL_TABLET | Freq: Two times a day (BID) | ORAL | 1 refills | Status: AC

## 2017-02-15 MED ORDER — POTASSIUM CHLORIDE CRYS ER 20 MEQ PO TBCR: 20.0000 meq | EXTENDED_RELEASE_TABLET | Freq: Every day | ORAL | 1 refills | Status: AC

## 2017-02-15 MED ORDER — IBUPROFEN 200 MG PO TABS
400.0000 mg | ORAL_TABLET | Freq: Four times a day (QID) | ORAL | Status: DC | PRN
  Administered 2017-02-15: 400 mg via ORAL
  Filled 2017-02-15: qty 2

## 2017-02-15 MED ORDER — FUROSEMIDE 20 MG PO TABS: 20.0000 mg | ORAL_TABLET | Freq: Every day | ORAL | 1 refills | Status: DC

## 2017-02-15 MED ORDER — AMLODIPINE BESYLATE 10 MG PO TABS: 10.0000 mg | ORAL_TABLET | Freq: Every day | ORAL | 1 refills | Status: DC

## 2017-02-15 NOTE — Care Management Note (Signed)
Case Management Note  Patient Details  Name: Theresa Soto MRN: [id number] Date of Birth: 1975/10/06  Subjective/Objective:                  41 y.o. female, History of poorly controlled hypertension, nonocclusive CAD per cardiac catheterization in 2016, morbid obesity BMI greater than 40, chronic diastolic CHF EF around 55% who lives at home and comes to the hospital with some chest discomfort, weight headache continuing numbness mostly on the left side of her body starting this morning, symptoms continued so she came to the ER where she was found to have extremely high but pressures of 220/110, EKG was unchanged, lab work showed mild hypokalemia   Action/Plan: Date:  Feb 15, 2017 Chart reviewed for concurrent status and case management needs. Will continue to follow patient progress. Discharge Planning: following for needs Expected discharge date: [id number] Marcelle SmilingRhonda Davis, BSN, [place], [place]   [phone]  Expected Discharge Date:   (unknown)               Expected Discharge Plan:  Home/Self Care  In-House Referral:     Discharge planning Services  CM Consult  Post Acute Care Choice:    Choice offered to:     DME Arranged:    DME Agency:     HH Arranged:    HH Agency:     Status of Service:  In process, will continue to follow  If discussed at Long Length of Stay Meetings, dates discussed:    Additional Comments:  Golda AcreDavis, Rhonda Lynn, RN 02/15/2017, 8:55 AM

## 2017-02-15 NOTE — Care Management Note (Signed)
Case Management Note  Patient Details  Name: Bonnita LevanMelisa Acoff MRN: [id number] Date of Birth: 13-Apr-1976  Subjective/Objective:   n/a                 Action/Plan: Date: Feb 15, 2017 Discharge orders review for case management needs.  None found Marcelle Smilinghonda Davis, BSN, [place]3, [place]: [phone]  Expected Discharge Date:  02/15/17               Expected Discharge Plan:  Home/Self Care  In-House Referral:     Discharge planning Services  CM Consult  Post Acute Care Choice:    Choice offered to:     DME Arranged:    DME Agency:     HH Arranged:    HH Agency:     Status of Service:  Completed, signed off  If discussed at MicrosoftLong Length of Stay Meetings, dates discussed:    Additional Comments:  Golda AcreDavis, Rhonda Lynn, RN 02/15/2017, 2:18 PM

## 2017-02-15 NOTE — Progress Notes (Signed)
Patient complaining of headache. Only PRN ordered is norco.  Patient with allergy to acetaminophen.  [facility] NP paged, one time dose of 5 mg oxycodone received.

## 2017-02-15 NOTE — Consult Note (Signed)
Cardiology Consult    Patient ID: Theresa Soto MRN: [id number], DOB/AGE: [birth year]   Admit date: 02/14/2017 Date of Consult: 02/15/2017  Primary Physician: Patient, No Pcp Per Primary Cardiologist: Dr. Clifton James Requesting Provider: Dr. Gwenlyn Perking  Reason for Consult: chest pain  Patient Profile    Theresa Soto has a PMH significant for HTN urgency (09/2014), chest pain s/p heart catheterization (09/2014) with no obstructive disease but elevated filling pressures and chronic diastolic heart failure, and medication noncompliance.  Theresa Soto is a 41 y.o. female who is being seen today for the evaluation of chest pain at the request of Dr. Gwenlyn Perking.    Past Medical History   Past Medical History:  Diagnosis Date  . CAD (coronary artery disease)    elevated TnI 1/16 in setting of HTN urgency (demand ischemia) >> LHC 1/16:  prox to mid LAD 40%, EF 55%  . Chronic diastolic CHF (congestive heart failure) (HCC)   . Hypertensive heart disease    a.  Echo 1/16:  severe LVH, EF 55-60%, severe LAE;  b.  Renal Art [place] 1/16:  no RAS    Past Surgical History:  Procedure Laterality Date  . LEFT HEART CATHETERIZATION WITH CORONARY ANGIOGRAM N/A 10/10/2014   Procedure: LEFT HEART CATHETERIZATION WITH CORONARY ANGIOGRAM;  Surgeon: Lesleigh Noe, MD;  Location: [facility] CATH LAB;  Service: Cardiovascular;  Laterality: N/A;  . None       Allergies  Allergies  Allergen Reactions  . Acetaminophen     hives  . Codeine     Hives  . Flexeril [Cyclobenzaprine] Hives  . Penicillins Itching and Rash    Has patient had a PCN reaction causing immediate rash, facial/tongue/throat swelling, SOB or lightheadedness with hypotension: yes Has patient had a PCN reaction causing severe rash involving mucus membranes or skin necrosis: yes Has patient had a PCN reaction that required hospitalization: no Has patient had a PCN reaction occurring within the last 10 years: no If all of the above answers are "NO",  then may proceed with Cephalosporin use.    History of Present Illness    Theresa Soto is known to our service through a hospitalization for hypertensive urgency in Jan 2016, but did not follow up in clinic. During that hospitalization, she underwent left heart cath that revealed nonobstructive disease in her LAD, but increased filling pressures consistent with chronic diastolic heart failure. Her medications norvasc and lopressor were titrated up. On discharge, HCTZ, lisinopril, and sprironolactone were added; lopressor was transitioned to coreg.   Theresa Soto was recently seen in the ED for abdominal pain (11/2016) and chest pain and shortness of breath (10/2016) thought to be secondary to medication noncompliance (lost prescriptions). She was also seen in the ED 11/2015 for medication noncompliance (awaiting insurance approval). Per those records, she was only taking norvasc and HCTZ as outpatient.  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 She got up anyway and continued to get ready for work. Upon arrival at work, she felt dizzy and lightheaded with left face tingling. A nurse at her job took her pressure and found her to be hypertensive and sent her to the [facility]. Upon arrival here, pressures were documented 208/127 and she states her chest pain was an 11/10. She describes shortness of breath, palpitations, nausea, dizziness, and near syncope.  She was given coreg, imdur, clonidine, nitro paste, and hydralazine on arrival. She states her chest pain decreased to a  4/10 following medications. Today, she states her chest pain continues at a 4/10, but she denies current shortness of breath, palpitations, dizziness and near syncope. She states she has been compliant on her norvasc and HCTZ. She has not followed with cardiology outpatient and it is unclear when she  last saw a PCP. She has recently obtained new insurance through her job and is looking for a PCP instead of returning to the free/subsidized clinic.   Inpatient Medications    . amLODipine  10 mg Oral Daily  . carvedilol  6.25 mg Oral BID WC  . heparin  5,000 Units Subcutaneous Q8H  . isosorbide mononitrate  30 mg Oral Daily  . sodium chloride flush  3 mL Intravenous Q12H     Outpatient Medications    Prior to Admission medications   Medication Sig Start Date End Date Taking? Authorizing Provider  amLODipine (NORVASC) 10 MG tablet Take 10 mg by mouth daily.   Yes [provider]  hydrochlorothiazide (HYDRODIURIL) 25 MG tablet Take 1 tablet (25 mg total) by mouth daily. 10/25/16  Yes [name], MD  potassium chloride SA (K-DUR,KLOR-CON) 20 MEQ tablet Take 1 tablet (20 mEq total) by mouth daily. 10/25/16  Yes [name], MD  HYDROcodone-acetaminophen (NORCO/VICODIN) 5-325 MG tablet Take 1-2 tablets by mouth every 6 (six) hours as needed for moderate pain or severe pain. Patient not taking: Reported on 02/14/2017 12/16/16   Muthersbaugh, Dahlia Client, PA-C  metoCLOPramide (REGLAN) 10 MG tablet Take 1 tablet (10 mg total) by mouth every 6 (six) hours. Patient not taking: Reported on 02/14/2017 10/25/16   [name], MD  nitrofurantoin, macrocrystal-monohydrate, (MACROBID) 100 MG capsule Take 1 capsule (100 mg total) by mouth 2 (two) times daily. X 7 days Patient not taking: Reported on 02/14/2017 12/16/16   Muthersbaugh, Dahlia Client, PA-C     Family History     Family History  Problem Relation Age of Onset  . Hypertension Father   . Kidney disease Father   . Heart attack Father     Social History    Social History   Social History  . Marital status: Single    Spouse name: N/A  . Number of children: 0  . Years of education: N/A   Occupational History  . Not on file.   Social History Main Topics  . Smoking status: Never Smoker  . Smokeless tobacco: Never Used  . Alcohol use  0.0 oz/week     Comment: Occasional glass of wine  . Drug use: No  . Sexual activity: Not on file   Other Topics Concern  . Not on file   Social History Narrative  . No narrative on file     Review of Systems    General:  No chills, fever, night sweats or weight changes.  Cardiovascular:  + chest pain, No dyspnea on exertion, edema, orthopnea, palpitations, paroxysmal nocturnal dyspnea. Dermatological: No rash, lesions/masses Respiratory: No cough, dyspnea Urologic: No hematuria, dysuria Abdominal:   No nausea, vomiting, diarrhea, bright red blood per rectum, melena, or hematemesis Neurologic:  No visual changes, changes in mental status. All other systems reviewed and are otherwise negative except as noted above.  Physical Exam    Blood pressure (!) 164/84, pulse 67, temperature 97.9 F (36.6 C), temperature source Oral, resp. rate 17, height 5\' 6"  (1.676 m), weight 276 lb 7.3 oz (125.4 kg), last  menstrual period 01/30/2017, SpO2 93 %.  General: Pleasant, NAD Psych: Normal affect. Neuro: Alert and oriented X 3. Moves all extremities spontaneously. HEENT: Normal  Neck: Supple without bruits or JVD. Lungs:  Resp regular and unlabored, CTA. Heart: RRR no s3, s4, or murmurs. Abdomen: Soft, non-tender, non-distended, BS + x 4.  Extremities: No clubbing, cyanosis, trace edema. DP/PT/Radials 2+ and equal bilaterally.  Labs    Troponin [facility] Test)  Recent Labs  02/14/17 4098  TROPIPOC 0.01    Recent Labs  02/14/17 1312 02/14/17 2034 02/15/17 0503  TROPONINI <0.03 <0.03 <0.03   Lab Results  Component Value Date   WBC 8.0 02/15/2017   HGB 11.7 (L) 02/15/2017   HCT 35.8 (L) 02/15/2017   MCV 81.4 02/15/2017   PLT 294 02/15/2017    Recent Labs Lab 02/15/17 0503  NA 136  K 3.7  CL 104  CO2 26  BUN 21*  CREATININE 1.31*  CALCIUM 9.0  GLUCOSE 106*   Lab Results  Component Value Date   CHOL 151 10/11/2014   HDL 62 10/11/2014   LDLCALC 66  10/11/2014   TRIG 116 10/11/2014   No results found for: [facility]   Radiology Studies    Dg Chest 2 View  Result Date: 02/14/2017 CLINICAL DATA:  Chest pain and shortness of breath EXAM: CHEST  2 VIEW COMPARISON:  October 24, 2016 FINDINGS: There is no edema or consolidation. Heart is borderline prominent with pulmonary vascularity within normal limits. No adenopathy. No pneumothorax. No bone lesions. IMPRESSION: Heart borderline prominent.  No edema or consolidation. Electronically Signed   By: Bretta Bang III M.D.   On: 02/14/2017 09:43    ECG & Cardiac Imaging    EKG 02/14/17: Sinus rhythm, nonspecific ST-T changes  Echocardiogram 02/15/17: Study Conclusions - Left ventricle: The cavity size was normal. Wall thickness was   increased in a pattern of severe LVH. Systolic function was   mildly reduced. The estimated ejection fraction was in the range   of 45% to 50%. Inferior hypokinesis. Doppler parameters are   consistent with abnormal left ventricular relaxation (grade 1   diastolic dysfunction). Abnormal GLPSS at -12% with inferior and   inferoseptal strain abnormality. The E/e&' ratio is between 8-15,   suggesting indeterminate (but likely elevated) LV filling   pressure. - Left atrium: The atrium was normal in size. - Systemic veins: The IVC was not visualized.  Impressions: - Compared to a prior study in 2016, the LVEF is lower at 45-50%   with inferior hypokinesis and corresponding longitudinal strain   abnormality, grade 1 DD and likely elevated LV filling pressure.   Heart Catheterization 10/10/14: IMPRESSIONS:  1. Widely patent coronary arteries. Proximal eccentric region of obstruction/tortuosity with up to 40% narrowing in the proximal to mid LAD. This region was not felt to be hemodynamically significant. 2. Normal left ventricular systolic function with markedly elevated end-diastolic pressures consistent with acute on chronic diastolic heart failure. 3.  Elevated troponin related to demand ischemia in the setting of severe left ventricular hypertrophy and elevated left ventricular filling pressures.  RECOMMENDATION:   1. Aggressive risk factor modification including statin therapy and aspirin 2. Aggressive blood pressure management. We will increase metoprolol tartrate 50 mg twice a day and amlodipine to 10 mg per day.  3. The patient will likely need to have a chronic diuretic added to her regimen. I would gauge renal function in a.m. prior to starting diuretic therapy. Diuretic will be necessary for  good blood pressure control and will also help to decrease filling pressure and dyspnea. 4.Further titration/addition of antihypertensive regimen depending upon response.   Echocardiogram 10/09/14: Study Conclusions - Left ventricle: The cavity size was normal. Wall thickness was increased in a pattern of severe LVH. Systolic function was normal. The estimated ejection fraction was in the range of 55% to 60%. - Left atrium: The atrium was severely dilated.    Assessment & Plan    1. Chest pain - troponin x 3 negative - EKG without clear signs of ischemia She has known CAD with 40% lesion in her LAD (2016). This episode of chest pain has typical and atypical features. Chest pain is likely related to her hypertensive urgency; however, given her history she is a candidate for further ischemic evaluation. Will discuss with attending inpatient myoview vs repeat heart cath today, although her past medication noncompliance needs to be considered in the case of possible PCI.  She is NPO.   2. Chronic diastolic heart failure, new onset mild systolic heart failure - elevated filling pressures via heart cath in 2016 - home medications include norvasc and HCTZ, she reports medication compliance - she states she was not taking previously prescribed coreg, lisinopril, and spironolactone (from 2016 hospitalization)   3. HTN urgency - admitted  with pressure of 220/110 - received coreg, imdur, clonidine, nitro paste, and hydralazine - pressure is now 164/84 - will likely need to reinstate heart failure medications and titrate as needed for BP   4. Acute on chronic kidney injury - baseline is unclear, was 1.36 10/24/16 in the ED - sCr on arrival was 1.07, which has increased to 1.31 today, likely related to her hypertensive urgency - will avoid ACEI and spironolactone for now - focus on controlling HTN with imdur, coreg, norvasc, and hydralazine, which are also preferred medications for combined heart failure   Signed, Marcelino Duster, PA-C 02/15/2017, 8:17 AM [id number]  As above, patient seen and examined. Briefly she is a 41 year old female with past medical history of hypertension and nonobstructive coronary disease who I am asked to evaluate for chest pain and hypertension. Patient has not been taking her medications recently because of financial faculties. She has chronic dyspnea on exertion and occasional PND. She denies pedal edema. She has occasional chest pain at home. Yesterday morning she awoke with chest tightness in the right chest area. It increased with certain movements. The pain lasted approximately one hour and resolved. No nausea, diaphoresis or dyspnea. She is presently pain-free. Cardiology asked to evaluate. Note patient had a cardiac catheterization in January 2016 because of chest pain. There is a 40% LAD but no other disease noted. Ejection fraction 55%. Troponins are normal. Electrocardiogram shows sinus rhythm, left ventricular hypertrophy and nonspecific T-wave changes. I have personally reviewed the patient's echocardiogram and feel the ejection fraction is normal with normal wall motion and severe left ventricular hypertrophy.  1 chest pain-symptoms are atypical. It increased with certain movements. Likely musculoskeletal. Previous catheterization revealed no obstructive coronary disease. I do not think  further ischemia evaluation is indicated.  2 hypertension-patient has been noncompliant with medications because of finances. We'll continue amlodipine and carvedilol. Discontinue isosorbide. Titrate medications as needed to control blood pressure. I stressed the importance of compliance.  3 coronary artery disease-previous 40% LAD. Would treat with aspirin 81 mg daily. Would add Lipitor 40 mg daily. Check lipids and liver in 4 weeks.  We will sign off. Patient can follow-up with Dr. Clifton James for cardiac  issues. Please call with questions.  Olga MillersBrian Roxanne Panek, MD

## 2017-02-15 NOTE — Discharge Summary (Signed)
Physician Discharge Summary  Bonnita LevanMelisa Hunke WUJ:[id number] DOB: 07/28/1976 DOA: 02/14/2017  PCP: Patient, No Pcp Per  Admit date: 02/14/2017 Discharge date: 02/15/2017  Time spent: 35 minutes  Recommendations for Outpatient Follow-up:  1. Repeat BMET to follow up electrolytes and renal function  2. Reassess BP and adjust antihypertensive regimen as needed  3. Patient to follow up with cardiology service at discharge in order to decide needs for further evaluation regarding CAD.   Discharge Diagnoses:  Principal Problem:   Hypertensive urgency Active Problems:   Pain in the chest   Hypertensive heart disease   CAD (coronary artery disease)   Chronic diastolic CHF (congestive heart failure) (HCC)   Discharge Condition: stable and improved. No further CP reported. VSS. Patient discharge home with instructions to be compliant with her medications and to follow up with PCP and cardiology service as an outpatient.  Diet recommendation: heart healthy and low calorie diet.  Filed Weights   02/14/17 1500 02/14/17 1624 02/15/17 0500  Weight: 122 kg (268 lb 15.4 oz) 122.9 kg (270 lb 15.1 oz) 125.4 kg (276 lb 7.3 oz)    History of present illness:  As per H&P written by Dr. Thedore MinsSingh on 02/14/17 41 y.o. female, History of poorly controlled hypertension, nonocclusive CAD per cardiac catheterization in 2016, morbid obesity BMI greater than 40, chronic diastolic CHF EF around 55% who lives at home and comes to the hospital with some chest discomfort, weight headache continuing numbness mostly on the left side of her body starting this morning, symptoms continued so she came to the ER where she was found to have extremely high but pressures of 220/110, EKG was unchanged, lab work showed mild hypokalemia and I was called to admit the patient.  Currently she is having minimal to no chest pressure, this is nonradiating, no aggravating or relieving factors, not associated with any other symptoms, she does  say that she had noticed for the last few days that upon lying flat she might get a little short of breath, does have mild generalized headache. Note any numbness or any focal weakness. No other subjective complaints.  Hospital Course:  1-hypertensive urgency: -improved and BP well controlled at discharge -patient discharge on carvedilol, lisinopril, amlodipine and lasix -instructed to follow low sodium diet and to lose weight  -will need follow up of BP and further adjustment on antihypertensive regimen as needed   2-atypical CP: most like secondary to #1. Had hx of non occlusive CAD. -troponin neg -chest pain resolved with treatment of hypertensive urgency  -2-D echo with slight decrease in EF and inferior hypokinesis, no ischemic changes on EKG or telemetry -per cardiology recommendations will continue ASA, continue B-blocker and ACE-inhibitor; patient ok to discharge and with plans for further evaluation and medication adjustment as an outpatient. 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 Wall thickness was   increased in a pattern of severe LVH.  Systolic function was   mildly reduced. The estimated ejection fraction was in the range   of 45% to 50%. Inferior hypokinesis. Doppler parameters are   consistent with abnormal left ventricular relaxation (grade 1    diastolic dysfunction). Abnormal GLPSS at -12% with inferior and   inferoseptal strain abnormality. The E/e&' ratio is between 8-15,   suggesting indeterminate (but likely elevated) LV filling   pressure. - Left atrium: The atrium was normal in size. - Systemic veins: The IVC was not visualized.  Impressions: - Compared to a prior study in 2016, the LVEF is lower at 45-50%   with inferior hypokinesis and corresponding longitudinal strain   abnormality, grade 1 DD and likely elevated LV filling pressure.  Consultations:  Cardiology   Discharge Exam: Vitals:   02/15/17 1200 02/15/17 1300  BP: 137/66 (!) 143/80  Pulse: 74 80  Resp: 19 (!) 22  Temp: 98.3 F (36.8 C)     General: afebrile, no CP, no SOB and no nausea or vomiting. Reports slight HA. Cardiovascular: S1 and S2, no rubs, gallops or rubs Respiratory: CTA bilaterally Abd: soft, NT, ND, positive BS Extremities: trace edema bilaterally, no cyanosis   Discharge Instructions   Discharge Instructions    Diet - low sodium heart healthy    Complete by:  As directed    Discharge instructions    Complete by:  As directed    Take medications as prescribed  Follow up with PCP in 10 days Arrange follow up with your cardiologist in 3 weeks (or earlier as needed for further CP) Keep yourself hydrated  Follow heart healthy diet and low sodium diet     Current Discharge Medication List    START taking these medications   Details  carvedilol (COREG) 12.5 MG tablet Take 1 tablet (12.5 mg total) by mouth 2 (two) times daily with a meal. Qty: 60 tablet, Refills: 1    furosemide (LASIX) 20 MG tablet Take 1 tablet (20 mg total) by mouth daily. Qty: 30 tablet, Refills: 1    lisinopril (PRINIVIL,ZESTRIL) 20 MG tablet Take 1 tablet (20 mg total) by mouth daily. Qty: 30 tablet, Refills: 1      CONTINUE these medications which have CHANGED   Details  amLODipine (NORVASC) 10 MG tablet Take 1 tablet (10 mg total) by mouth  daily. Qty: 30 tablet, Refills: 1    potassium chloride SA (K-DUR,KLOR-CON) 20 MEQ tablet Take 1 tablet (20 mEq total) by mouth daily. Qty: 30 tablet, Refills: 1      STOP taking these medications     hydrochlorothiazide (HYDRODIURIL) 25 MG tablet      HYDROcodone-acetaminophen (NORCO/VICODIN) 5-325 MG tablet      metoCLOPramide (REGLAN) 10 MG tablet      nitrofurantoin, macrocrystal-monohydrate, (MACROBID) 100 MG capsule        Allergies  Allergen Reactions  . Acetaminophen     hives  . Codeine     Hives  . Flexeril [Cyclobenzaprine] Hives  . Penicillins Itching and Rash    Has patient had a PCN reaction causing immediate rash, facial/tongue/throat swelling, SOB or lightheadedness with hypotension: yes Has patient had a PCN reaction causing severe rash involving mucus membranes or skin necrosis: yes Has patient had a PCN reaction that required hospitalization: no Has patient had a PCN reaction occurring within the last 10 years: no If all of the above answers are "NO", then may proceed with Cephalosporin use.   Follow-up Information    Kathleene Hazel,  MD Follow up in 3 week(s).   Specialty:  Cardiology Contact information: [address] [place] [address] [phone]            The results of significant diagnostics from this hospitalization (including imaging, microbiology, ancillary and laboratory) are listed below for reference.    Significant Diagnostic Studies: Dg Chest 2 View  Result Date: 02/14/2017 CLINICAL DATA:  Chest pain and shortness of breath EXAM: CHEST  2 VIEW COMPARISON:  October 24, 2016 FINDINGS: There is no edema or consolidation. Heart is borderline prominent with pulmonary vascularity within normal limits. No adenopathy. No pneumothorax. No bone lesions. IMPRESSION: Heart borderline prominent.  No edema or consolidation. Electronically Signed   By: Bretta Bang III M.D.   On: 02/14/2017 09:43     Microbiology: Recent Results (from the past 240 hour(s))  MRSA PCR Screening     Status: None   Collection Time: 02/14/17  4:22 PM  Result Value Ref Range Status   MRSA by PCR NEGATIVE NEGATIVE Final    Comment:        The GeneXpert MRSA Assay (FDA approved for NASAL specimens only), is one component of a comprehensive MRSA colonization surveillance program. It is not intended to diagnose MRSA infection nor to guide or monitor treatment for MRSA infections.      Labs: Basic Metabolic Panel:  Recent Labs Lab 02/14/17 0918 02/14/17 1649 02/15/17 0503  NA 137  --  136  K 3.0*  --  3.7  CL 103  --  104  CO2 26  --  26  GLUCOSE 131*  --  106*  BUN 16  --  21*  CREATININE 1.07* 1.25* 1.31*  CALCIUM 8.9  --  9.0  MG  --   --  2.0   Liver Function Tests: No results for input(s): AST, ALT, ALKPHOS, BILITOT, PROT, ALBUMIN in the last 168 hours. No results for input(s): LIPASE, AMYLASE in the last 168 hours. No results for input(s): AMMONIA in the last 168 hours. CBC:  Recent Labs Lab 02/14/17 0918 02/14/17 1649 02/15/17 0503  WBC 6.7 6.7 8.0  HGB 13.0 12.4 11.7*  HCT 39.1 36.8 35.8*  MCV 81.5 81.2 81.4  PLT 322 298 294   Cardiac Enzymes:  Recent Labs Lab 02/14/17 1312 02/14/17 2034 02/15/17 0503  TROPONINI <0.03 <0.03 <0.03   BNP: BNP (last 3 results)  Recent Labs  02/14/17 1649  BNP 30.9    Signed:  Vassie Loll MD.  [facility] 02/15/2017, 1:20 PM

## 2017-02-15 NOTE — Progress Notes (Signed)
02/15/17  1430  Reviewed discharge instructions with patient. Patient verbalized understanding of discharge instructions. Copy of discharge instructions, prescriptions, and work note given to patient.

## 2017-08-23 ENCOUNTER — Ambulatory Visit (HOSPITAL_COMMUNITY)
Admission: EM | Admit: 2017-08-23 | Discharge: 2017-08-23 | Disposition: A | Discharge status: Home / Self Care | Payer: Self-pay | Attending: Family Medicine | Admitting: Family Medicine

## 2017-08-23 ENCOUNTER — Encounter (HOSPITAL_COMMUNITY): Payer: Self-pay | Admitting: *Deleted

## 2017-08-23 DIAGNOSIS — I10 Essential (primary) hypertension: Secondary | ICD-10-CM

## 2017-08-23 MED ORDER — AMLODIPINE BESYLATE 10 MG PO TABS: 10.0000 mg | ORAL_TABLET | Freq: Every day | ORAL | 1 refills | Status: AC

## 2017-08-23 MED ORDER — LISINOPRIL 20 MG PO TABS: 20.0000 mg | ORAL_TABLET | Freq: Every day | ORAL | 1 refills | Status: AC

## 2017-08-23 MED ORDER — FUROSEMIDE 20 MG PO TABS: 20.0000 mg | ORAL_TABLET | Freq: Every day | ORAL | 1 refills | Status: AC

## 2017-08-23 NOTE — ED Notes (Signed)
Informed Natalie with pt Vitals

## 2017-08-23 NOTE — ED Triage Notes (Addendum)
Per pt she is here for her BP, per pt she feels fine but she's here due to her job, per pt due to her previous BP reading, her BP was high, per pt she needs a letter for her job

## 2017-08-23 NOTE — ED Provider Notes (Signed)
[facility]   [id number] 08/23/17 Arrival Time: 1103  ASSESSMENT & PLAN:  1. Essential hypertension     Meds ordered this encounter  Medications  . amLODipine (NORVASC) 10 MG tablet    Sig: Take 1 tablet (10 mg total) by mouth daily.    Dispense:  30 tablet    Refill:  1  . lisinopril (PRINIVIL,ZESTRIL) 20 MG tablet    Sig: Take 1 tablet (20 mg total) by mouth daily.    Dispense:  30 tablet    Refill:  1  . furosemide (LASIX) 20 MG tablet    Sig: Take 1 tablet (20 mg total) by mouth daily.    Dispense:  30 tablet    Refill:  1   Information given regarding finding a PCP. She will start taking above medications again. Work form completed (see scan). She may f/u here as needed.  Reviewed expectations re: course of current medical issues. Questions answered. Outlined signs and symptoms indicating need for more acute intervention. Patient verbalized understanding. After Visit Summary given.   SUBJECTIVE:  Theresa Soto is a 41 y.o. female who presents with complaint of HTN. Out of medications for approx 2 weeks. Needs work note completed that states she has no restrictions at work. She reports no trouble performing her work duties. Otherwise she is feeling well.  ROS: As per HPI.   OBJECTIVE:  Vitals:   08/23/17 1148  BP: (!) 185/115  Pulse: 84  Temp: 98.3 F (36.8 C)  TempSrc: Oral  SpO2: 98%    General appearance: alert; no distress Lungs: clear to auscultation bilaterally Heart: regular rate and rhythm Abdomen: soft, non-tender Back: no CVA tenderness Extremities: no cyanosis or edema; symmetrical with no gross deformities Skin: warm and dry Psychological: alert and cooperative; normal mood and affect   Allergies  Allergen Reactions  . Acetaminophen     hives  . Codeine     Hives  . Flexeril [Cyclobenzaprine] Hives  . Penicillins Itching and Rash    Has patient had a PCN reaction causing immediate rash, facial/tongue/throat swelling, SOB  or lightheadedness with hypotension: yes Has patient had a PCN reaction causing severe rash involving mucus membranes or skin necrosis: yes Has patient had a PCN reaction that required hospitalization: no Has patient had a PCN reaction occurring within the last 10 years: no If all of the above answers are "NO", then may proceed with Cephalosporin use.    Past Medical History:  Diagnosis Date  . CAD (coronary artery disease)    elevated TnI 1/16 in setting of HTN urgency (demand ischemia) >> LHC 1/16:  prox to mid LAD 40%, EF 55%  . Chronic diastolic CHF (congestive heart failure) (HCC)   . Hypertensive heart disease    a.  Echo 1/16:  severe LVH, EF 55-60%, severe LAE;  b.  Renal Art [place] 1/16:  no RAS   Social History   Socioeconomic History  . Marital status: Single    Spouse name: Not on file  . Number of children: 0  . Years of education: Not on file  . Highest education level: Not on file  Social Needs  . Financial resource strain: Not on file  . Food insecurity - worry: Not on file  . Food insecurity - inability: Not on file  . Transportation needs - medical: Not on file  . Transportation needs - non-medical: Not on file  Occupational History  . Not on file  Tobacco Use  . Smoking status:  Never Smoker  . Smokeless tobacco: Never Used  Substance and Sexual Activity  . Alcohol use: Yes    Alcohol/week: 0.0 oz    Comment: Occasional glass of wine  . Drug use: No  . Sexual activity: Not on file  Other Topics Concern  . Not on file  Social History Narrative  . Not on file   Family History  Problem Relation Age of Onset  . Hypertension Father   . Kidney disease Father   . Heart attack Father    Past Surgical History:  Procedure Laterality Date  . LEFT HEART CATHETERIZATION WITH CORONARY ANGIOGRAM N/A 10/10/2014   Procedure: LEFT HEART CATHETERIZATION WITH CORONARY ANGIOGRAM;  Surgeon: Lesleigh NoeHenry W Smith III, MD;  [facility];  Service: Cardiovascular;   Laterality: N/A;  . None       Mardella LaymanHagler, Silver Achey, MD 08/23/17 1301

## 2017-08-23 NOTE — ED Notes (Signed)
Made copy of  Pre-printed paper labeled as a medical statement for employment, labled and placed in bin for scanning into medical record.

## 2017-12-11 ENCOUNTER — Encounter (HOSPITAL_COMMUNITY): Payer: Self-pay | Admitting: Family Medicine

## 2017-12-11 ENCOUNTER — Ambulatory Visit (HOSPITAL_COMMUNITY)
Admission: EM | Admit: 2017-12-11 | Discharge: 2017-12-11 | Disposition: A | Discharge status: Home / Self Care | Payer: BLUE CROSS/BLUE SHIELD

## 2017-12-11 DIAGNOSIS — I160 Hypertensive urgency: Secondary | ICD-10-CM

## 2017-12-11 DIAGNOSIS — R030 Elevated blood-pressure reading, without diagnosis of hypertension: Secondary | ICD-10-CM

## 2017-12-11 DIAGNOSIS — R519 Headache, unspecified: Secondary | ICD-10-CM

## 2017-12-11 DIAGNOSIS — I10 Essential (primary) hypertension: Secondary | ICD-10-CM

## 2017-12-11 DIAGNOSIS — R42 Dizziness and giddiness: Secondary | ICD-10-CM

## 2017-12-11 DIAGNOSIS — R51 Headache: Secondary | ICD-10-CM

## 2017-12-11 NOTE — Discharge Instructions (Addendum)
Please report to the ER immediately for management of your hypertensive urgency.    Wallis BambergMario Deivi Huckins, PA-C [facility] at [facility] [address], [place], [place] [id number]

## 2017-12-11 NOTE — ED Provider Notes (Signed)
MRN: [id number] DOB: 31-Jul-1976  Subjective:   Theresa Soto is a 42 y.o. female presenting for 4 day history of posterior headache, pulsating sound in her right ear, dizziness over the weekend. 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 Denies confusion, shob, heart racing, chest pain, n/v, abdominal pain, hematuria, lower leg swelling. Took amlodipine, Lasix, carvedilol. She does not take lisinopril because it causes headaches. Last dose was Saturday. Denies smoking cigarettes. Drinks alcohol rarely. Admits some stress today related to the anniversary of her father's death in 14-Feb-2016.  No current facility-administered medications for this encounter.   Current Outpatient Medications:  .  amLODipine (NORVASC) 10 MG tablet, Take 1 tablet (10 mg total) by mouth daily., Disp: 30 tablet, Rfl: 1 .  carvedilol (COREG) 12.5 MG tablet, Take 1 tablet (12.5 mg total) by mouth 2 (two) times daily with a meal., Disp: 60 tablet, Rfl: 1 .  furosemide (LASIX) 20 MG tablet, Take 1 tablet (20 mg total) by mouth daily., Disp: 30 tablet, Rfl: 1 .  lisinopril (PRINIVIL,ZESTRIL) 20 MG tablet, Take 1 tablet (20 mg total) by mouth daily., Disp: 30 tablet, Rfl: 1 .  potassium chloride SA (K-DUR,KLOR-CON) 20 MEQ tablet, Take 1 tablet (20 mEq total) by mouth daily., Disp: 30 tablet, Rfl: 1   Allergies  Allergen Reactions  . Acetaminophen     hives  . Codeine     Hives  . Flexeril [Cyclobenzaprine] Hives  . Penicillins Itching and Rash    Has patient had a PCN reaction causing immediate rash, facial/tongue/throat swelling, SOB or lightheadedness with hypotension: yes Has patient had a PCN reaction causing severe rash involving mucus membranes or skin necrosis: yes Has patient had a PCN reaction that required hospitalization: no Has patient had a PCN reaction occurring within the last 10 years: no If all of the above answers are "NO", then may proceed with  Cephalosporin use.    Past Medical History:  Diagnosis Date  . CAD (coronary artery disease)    elevated TnI 1/16 in setting of HTN urgency (demand ischemia) >> LHC 1/16:  prox to mid LAD 40%, EF 55%  . Chronic diastolic CHF (congestive heart failure) (HCC)   . Hypertensive heart disease    a.  Echo 1/16:  severe LVH, EF 55-60%, severe LAE;  b.  Renal Art [place] 1/16:  no RAS     Past Surgical History:  Procedure Laterality Date  . LEFT HEART CATHETERIZATION WITH CORONARY ANGIOGRAM N/A 10/10/2014   Procedure: LEFT HEART CATHETERIZATION WITH CORONARY ANGIOGRAM;  Surgeon: Lesleigh Noe, MD;  Location: [facility] CATH LAB;  Service: Cardiovascular;  Laterality: N/A;  . None      Objective:   Vitals: BP (!) 220/131   Pulse 80   Temp 98.2 F (36.8 C)   Resp 18   SpO2 97%   BP Readings from Last 3 Encounters:  12/11/17 (!) 220/131  08/23/17 (!) 185/115  02/15/17 (!) 143/80    Physical Exam  Constitutional: She is oriented to person, place, and time. She appears well-developed and well-nourished.  HENT:  Mouth/Throat: Oropharynx is clear and moist.  Eyes: Pupils are equal, round, and reactive to light. EOM are normal. No scleral icterus.  Neck: Normal range of motion. Neck supple. No JVD present.  Cardiovascular: Normal rate, regular rhythm and intact distal pulses. Exam reveals no gallop and no friction rub.  No murmur heard. Pulmonary/Chest: No respiratory distress. She has  no wheezes. She has no rales.  Musculoskeletal: She exhibits no edema.  Neurological: She is alert and oriented to person, place, and time. She displays normal reflexes. No cranial nerve deficit. Coordination normal.  Negative Romberg and Pronator Drift.  Skin: Skin is warm and dry.  Psychiatric: She has a normal mood and affect.   Assessment and Plan :   Hypertensive urgency  Essential hypertension  Elevated blood pressure reading  Acute nonintractable headache, unspecified headache  type  Dizziness  Referred to ER for management of HTN urgency. Provided patient with work note through Wednesday so that she can manage her blood pressure.    Wallis BambergMani, Jerald Hennington, PA-C 12/11/17 1037

## 2017-12-11 NOTE — ED Triage Notes (Signed)
Pt here headache and hypertension. She checked it at work this am and was 229/153. She is under a lot of stress. Denies chest pain and SOB. Denies dizziness. She took her meds this am.

## 2018-01-26 IMAGING — CR DG CHEST 2V
2 series · 2 of 2 positions shown · non-contrast
Comparison: October 24, 2016

CLINICAL DATA: Chest pain and shortness of breath

EXAM:
CHEST  2 VIEW

[w chest pa]
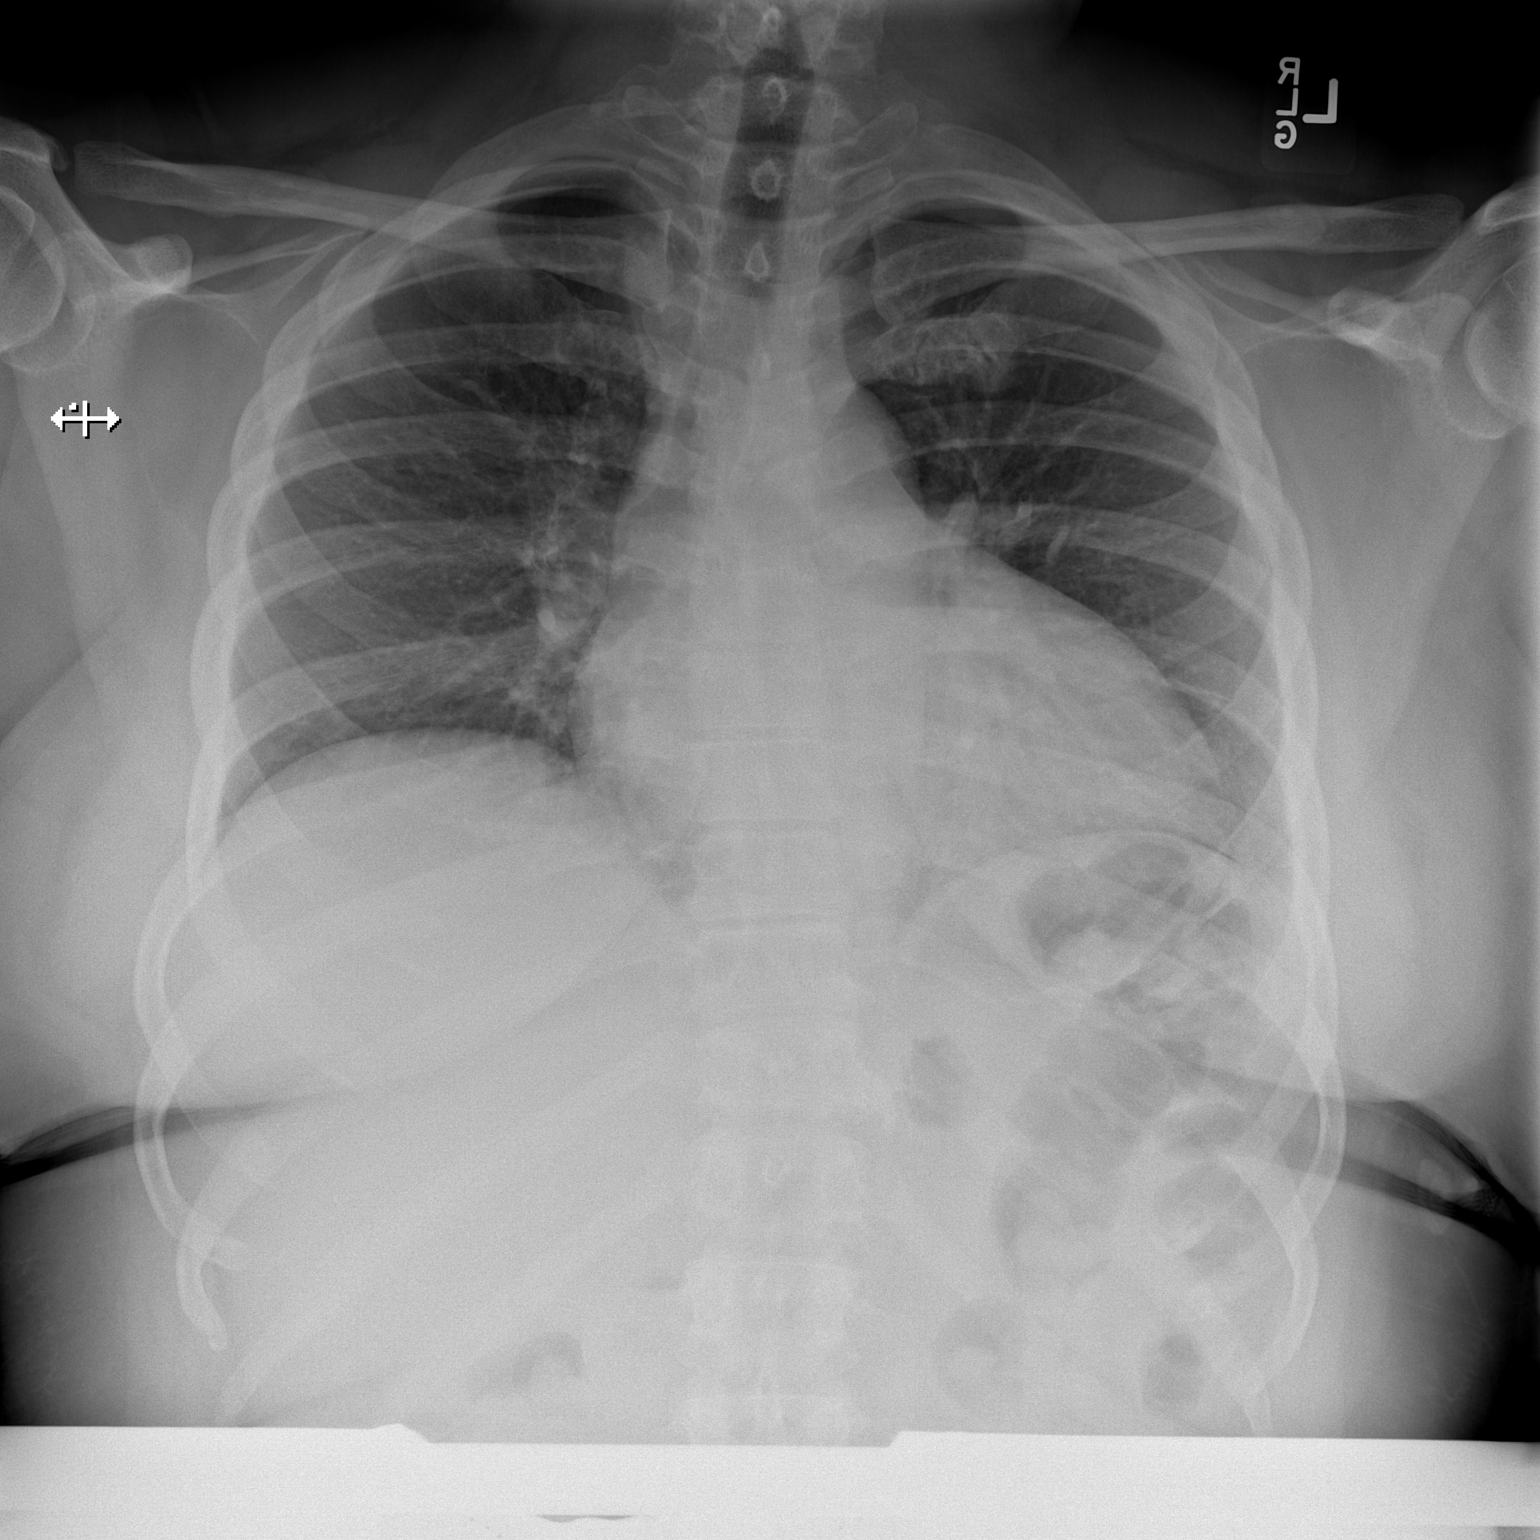

[w chest lat]
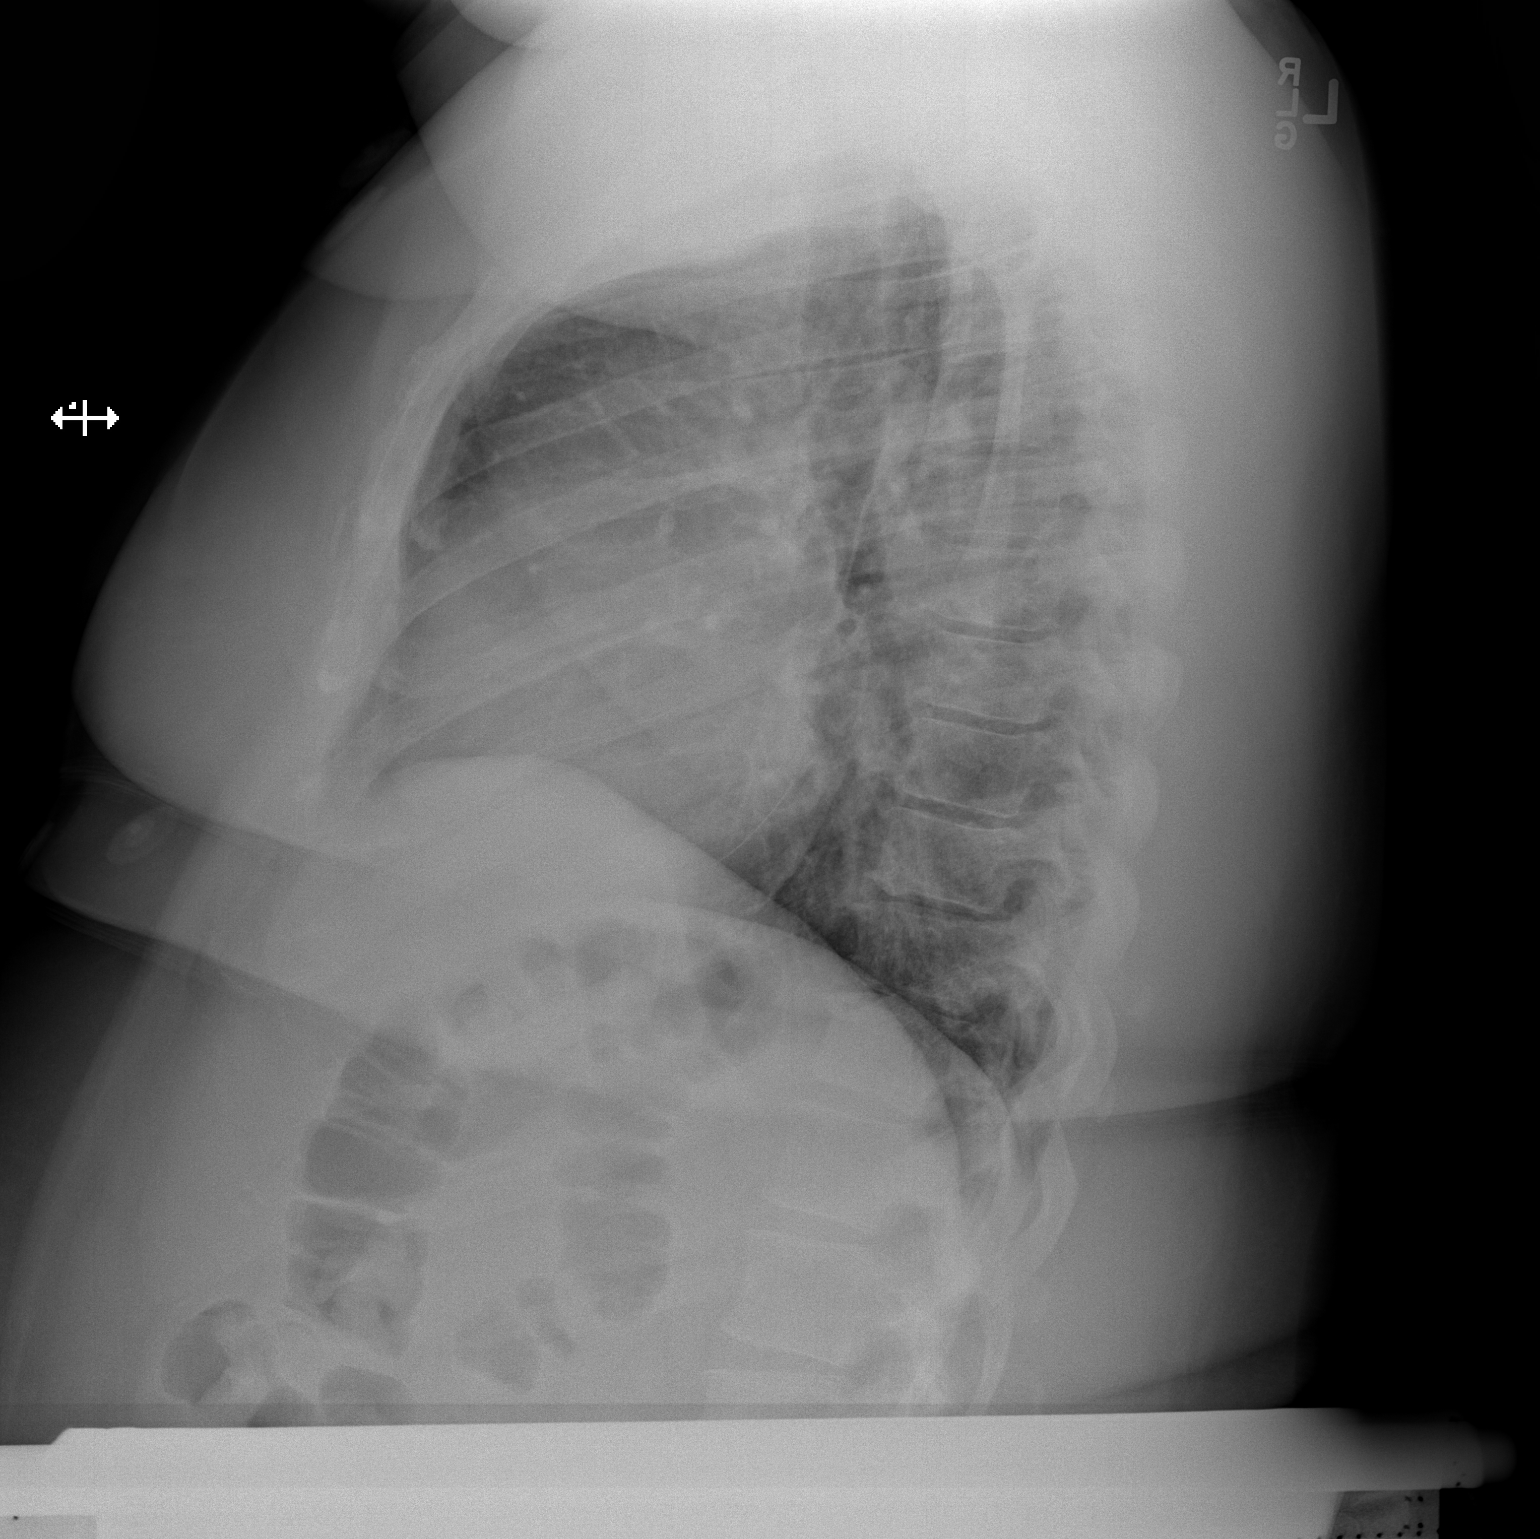

[2 of 2 positions shown; findings below may reference images not displayed]

FINDINGS: There is no edema or consolidation. Heart is borderline prominent
with pulmonary vascularity within normal limits. No adenopathy. No
pneumothorax. No bone lesions.
IMPRESSION: Heart borderline prominent.  No edema or consolidation.

## 2018-02-14 ENCOUNTER — Encounter (HOSPITAL_COMMUNITY): Payer: Self-pay | Admitting: Emergency Medicine

## 2018-02-14 ENCOUNTER — Ambulatory Visit (HOSPITAL_COMMUNITY)
Admission: EM | Admit: 2018-02-14 | Discharge: 2018-02-14 | Disposition: A | Discharge status: Home / Self Care | Payer: BLUE CROSS/BLUE SHIELD | Attending: Family Medicine | Admitting: Family Medicine

## 2018-02-14 DIAGNOSIS — R197 Diarrhea, unspecified: Secondary | ICD-10-CM

## 2018-02-14 DIAGNOSIS — I10 Essential (primary) hypertension: Secondary | ICD-10-CM

## 2018-02-14 DIAGNOSIS — R059 Cough, unspecified: Secondary | ICD-10-CM

## 2018-02-14 DIAGNOSIS — R05 Cough: Secondary | ICD-10-CM

## 2018-02-14 DIAGNOSIS — J069 Acute upper respiratory infection, unspecified: Secondary | ICD-10-CM

## 2018-02-14 MED ORDER — ONDANSETRON 8 MG PO TBDP
8.0000 mg | ORAL_TABLET | Freq: Three times a day (TID) | ORAL | 0 refills | Status: AC | PRN
Start: 2018-02-14 — End: ?

## 2018-02-14 MED ORDER — BENZONATATE 100 MG PO CAPS: 100.0000 mg | ORAL_CAPSULE | Freq: Three times a day (TID) | ORAL | 0 refills | Status: AC | PRN

## 2018-02-14 NOTE — Discharge Instructions (Signed)
Your repeat blood pressure is 173/103.  This is still high but not dangerous.  Make sure you continue to take your blood pressure medicine.

## 2018-02-14 NOTE — ED Provider Notes (Signed)
[facility]   [id number] 02/14/18 Arrival Time: 1114   SUBJECTIVE:  Theresa Soto is a 42 y.o. female who presents to the urgent care with complaint of URI sx with diarrhea and nausea; pt noted to be hypertensive with hx of same   Symptoms initially began 2 days ago with some congestion in her chest and cough.  It was worse at night.  Yesterday patient had 6 episodes of watery diarrhea and again today, every time she went to drink something, she had a loose stool.  She said no shortness of breath or chest pain, no edema.  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  Past Medical History:  Diagnosis Date  . CAD (coronary artery disease)    elevated TnI 1/16 in setting of HTN urgency (demand ischemia) >> LHC 1/16:  prox to mid LAD 40%, EF 55%  . Chronic diastolic CHF (congestive heart failure) (HCC)   . Hypertensive heart disease    a.  Echo 1/16:  severe LVH, EF 55-60%, severe LAE;  b.  Renal Art [place] 1/16:  no RAS   Family History  Problem Relation Age of Onset  . Hypertension Father   . Kidney disease Father   . Heart attack Father    Social History   Socioeconomic History  . Marital status: Single    Spouse name: Not on file  . Number of children: 0  . Years of education: Not on file  . Highest education level: Not on file  Occupational History  . Not on file  Social Needs  . Financial resource strain: Not on file  . Food insecurity:    Worry: Not on file    Inability: Not on file  . Transportation needs:    Medical: Not on file    Non-medical: Not on file  Tobacco Use  . Smoking status: Never Smoker  . Smokeless tobacco: Never Used  Substance and Sexual Activity  . Alcohol use: Yes    Alcohol/week: 0.0 oz    Comment: Occasional glass of wine  . Drug use: No  . Sexual activity: Not on file  Lifestyle  . Physical activity:    Days per week: Not on file    Minutes per session: Not on file  . Stress: Not on file  Relationships  . Social connections:    Talks on phone: Not on file    Gets together: Not on file    Attends religious service: Not on file    Active member of club or organization: Not on file    Attends meetings of clubs or organizations: Not on file    Relationship status: Not on file  . Intimate partner violence:    Fear of current or ex partner: Not on file    Emotionally abused: Not on file    Physically abused: Not on file    Forced sexual activity: Not on file  Other Topics Concern  . Not on file  Social History Narrative  . Not on file   No outpatient medications have been marked as taking for the 02/14/18 encounter [facility] Encounter).   Allergies  Allergen Reactions  . Acetaminophen     hives  . Codeine     Hives  . Flexeril [Cyclobenzaprine] Hives  . Penicillins Itching and Rash    Has patient had a PCN reaction causing immediate rash, facial/tongue/throat swelling, SOB or lightheadedness with hypotension: yes Has patient had a PCN reaction causing severe rash involving mucus membranes or skin necrosis: yes  Has patient had a PCN reaction that required hospitalization: no Has patient had a PCN reaction occurring within the last 10 years: no If all of the above answers are "NO", then may proceed with Cephalosporin use.      ROS: As per HPI, remainder of ROS negative.   OBJECTIVE:   Vitals:   02/14/18 1202  BP: (!) 228/126  Pulse: 72  Resp: 18  Temp: 97.6 F (36.4 C)  TempSrc: Oral  SpO2: 99%     General appearance: alert; no distress Eyes: PERRL; EOMI; conjunctiva normal HENT: normocephalic; atraumatic; TMs normal, canal normal, external ears normal without trauma; nasal mucosa normal; oral mucosa normal Neck: supple Lungs: clear to auscultation bilaterally Heart: regular rate and rhythm Abdomen: soft, non-tender; bowel sounds normal; no masses or organomegaly; no guarding or rebound tenderness Back: no CVA tenderness Extremities: no cyanosis or edema; symmetrical with no gross  deformities Skin: warm and dry Neurologic: normal gait; grossly normal Psychological: alert and cooperative; normal mood and affect      Labs:  Results for orders placed or performed during the hospital encounter of 02/14/17  MRSA PCR Screening  Result Value Ref Range   MRSA by PCR NEGATIVE NEGATIVE  Basic metabolic panel  Result Value Ref Range   Sodium 137 135 - 145 mmol/L   Potassium 3.0 (L) 3.5 - 5.1 mmol/L   Chloride 103 101 - 111 mmol/L   CO2 26 22 - 32 mmol/L   Glucose, Bld 131 (H) 65 - 99 mg/dL   BUN 16 6 - 20 mg/dL   Creatinine, Ser 1.61 (H) 0.44 - 1.00 mg/dL   Calcium 8.9 8.9 - 09.6 mg/dL   GFR calc non Af Amer >60 >60 mL/min   GFR calc Af Amer >60 >60 mL/min   Anion gap 8 5 - 15  CBC  Result Value Ref Range   WBC 6.7 4.0 - 10.5 K/uL   RBC 4.80 3.87 - 5.11 MIL/uL   Hemoglobin 13.0 12.0 - 15.0 g/dL   HCT 04.5 40.9 - 81.1 %   MCV 81.5 78.0 - 100.0 fL   MCH 27.1 26.0 - 34.0 pg   MCHC 33.2 30.0 - 36.0 g/dL   RDW 91.4 78.2 - 95.6 %   Platelets 322 150 - 400 K/uL  Pregnancy, urine  Result Value Ref Range   Preg Test, Ur NEGATIVE NEGATIVE  Troponin I  Result Value Ref Range   Troponin I <0.03 <0.03 ng/mL  Troponin I  Result Value Ref Range   Troponin I <0.03 <0.03 ng/mL  Urine rapid drug screen (hosp performed)  Result Value Ref Range   Opiates NONE DETECTED NONE DETECTED   Cocaine NONE DETECTED NONE DETECTED   Benzodiazepines NONE DETECTED NONE DETECTED   Amphetamines NONE DETECTED NONE DETECTED   Tetrahydrocannabinol NONE DETECTED NONE DETECTED   Barbiturates NONE DETECTED NONE DETECTED  HIV antibody (Routine Testing)  Result Value Ref Range   HIV Screen 4th Generation wRfx Non Reactive Non Reactive  CBC  Result Value Ref Range   WBC 6.7 4.0 - 10.5 K/uL   RBC 4.53 3.87 - 5.11 MIL/uL   Hemoglobin 12.4 12.0 - 15.0 g/dL   HCT 21.3 08.6 - 57.8 %   MCV 81.2 78.0 - 100.0 fL   MCH 27.4 26.0 - 34.0 pg   MCHC 33.7 30.0 - 36.0 g/dL   RDW 46.9 62.9 - 52.8  %   Platelets 298 150 - 400 K/uL  Creatinine, serum  Result Value Ref Range  Creatinine, Ser 1.25 (H) 0.44 - 1.00 mg/dL   GFR calc non Af Amer 53 (L) >60 mL/min   GFR calc Af Amer >60 >60 mL/min  TSH  Result Value Ref Range   TSH 3.497 0.350 - 4.500 uIU/mL  Brain natriuretic peptide  Result Value Ref Range   B Natriuretic Peptide 30.9 0.0 - 100.0 pg/mL  Troponin I  Result Value Ref Range   Troponin I <0.03 <0.03 ng/mL  Magnesium  Result Value Ref Range   Magnesium 2.0 1.7 - 2.4 mg/dL  Basic metabolic panel  Result Value Ref Range   Sodium 136 135 - 145 mmol/L   Potassium 3.7 3.5 - 5.1 mmol/L   Chloride 104 101 - 111 mmol/L   CO2 26 22 - 32 mmol/L   Glucose, Bld 106 (H) 65 - 99 mg/dL   BUN 21 (H) 6 - 20 mg/dL   Creatinine, Ser 4.09 (H) 0.44 - 1.00 mg/dL   Calcium 9.0 8.9 - 81.1 mg/dL   GFR calc non Af Amer 50 (L) >60 mL/min   GFR calc Af Amer 58 (L) >60 mL/min   Anion gap 6 5 - 15  CBC  Result Value Ref Range   WBC 8.0 4.0 - 10.5 K/uL   RBC 4.40 3.87 - 5.11 MIL/uL   Hemoglobin 11.7 (L) 12.0 - 15.0 g/dL   HCT 91.4 (L) 78.2 - 95.6 %   MCV 81.4 78.0 - 100.0 fL   MCH 26.6 26.0 - 34.0 pg   MCHC 32.7 30.0 - 36.0 g/dL   RDW 21.3 08.6 - 57.8 %   Platelets 294 150 - 400 K/uL  POCT i-Stat troponin I  Result Value Ref Range   Troponin i, poc 0.01 0.00 - 0.08 ng/mL   Comment 3          ECHOCARDIOGRAM COMPLETE  Result Value Ref Range   BP 143/89 mmHg    Labs Reviewed - No data to display  No results found.     ASSESSMENT & PLAN:  1. Acute upper respiratory infection   2. Cough   3. Diarrhea of presumed infectious origin   4. Essential hypertension   URI sx with diarrhea and nausea; pt noted to be hypertensive with hx of same   Meds ordered this encounter  Medications  . ondansetron (ZOFRAN-ODT) 8 MG disintegrating tablet    Sig: Take 1 tablet (8 mg total) by mouth every 8 (eight) hours as needed for nausea.    Dispense:  12 tablet    Refill:  0  .  benzonatate (TESSALON) 100 MG capsule    Sig: Take 1-2 capsules (100-200 mg total) by mouth 3 (three) times daily as needed for cough.    Dispense:  40 capsule    Refill:  0    Reviewed expectations re: course of current medical issues. Questions answered. Outlined signs and symptoms indicating need for more acute intervention. Patient verbalized understanding. After Visit Summary given.       Elvina Sidle, MD 02/14/18 1243

## 2018-02-14 NOTE — ED Triage Notes (Signed)
Pt here c/o URI sx with diarrhea and nausea; pt noted to be hypertensive with hx of same
